# Patient Record
Sex: Male | Born: 1940 | Hispanic: No | Marital: Married | Smoking: Never smoker
Health system: Southern US, Community
[De-identification: ages and names within clinical notes are randomized; demographics above are authoritative.]

## PROBLEM LIST (undated history)

## (undated) DIAGNOSIS — I10 Essential (primary) hypertension: Secondary | ICD-10-CM

## (undated) DIAGNOSIS — H269 Unspecified cataract: Secondary | ICD-10-CM

## (undated) DIAGNOSIS — L409 Psoriasis, unspecified: Secondary | ICD-10-CM

## (undated) DIAGNOSIS — N200 Calculus of kidney: Secondary | ICD-10-CM

## (undated) HISTORY — PX: WRIST SURGERY: SHX841

## (undated) HISTORY — PX: EYE SURGERY: SHX253

---

## 2004-05-23 ENCOUNTER — Encounter: Admission: RE | Admit: 2004-05-23 | Discharge: 2004-05-23 | Payer: Self-pay | Admitting: Specialist

## 2004-08-12 ENCOUNTER — Encounter: Admission: RE | Admit: 2004-08-12 | Discharge: 2004-08-12 | Payer: Self-pay | Admitting: Family Medicine

## 2005-12-15 ENCOUNTER — Ambulatory Visit: Payer: Self-pay | Admitting: Family Medicine

## 2005-12-15 LAB — CONVERTED CEMR LAB
ALT: 17 units/L (ref 0–40)
Basophils Absolute: 0.1 10*3/uL (ref 0.0–0.1)
Basophils Relative: 0.5 % (ref 0.0–1.0)
Calcium: 9.1 mg/dL (ref 8.4–10.5)
Chloride: 105 meq/L (ref 96–112)
Creatinine, Ser: 1.1 mg/dL (ref 0.4–1.5)
Eosinophil percent: 3.9 % (ref 0.0–5.0)
Glucose, Bld: 112 mg/dL — ABNORMAL HIGH (ref 70–99)
HCT: 40.2 % (ref 39.0–52.0)
Hemoglobin: 13.7 g/dL (ref 13.0–17.0)
Monocytes Absolute: 1.1 10*3/uL — ABNORMAL HIGH (ref 0.2–0.7)
Neutro Abs: 7.6 10*3/uL (ref 1.4–7.7)
Neutrophils Relative %: 74.2 % (ref 43.0–77.0)
Potassium: 3.6 meq/L (ref 3.5–5.1)
RBC: 4 M/uL — ABNORMAL LOW (ref 4.22–5.81)
RDW: 13.6 % (ref 11.5–14.6)
Sodium: 138 meq/L (ref 135–145)
WBC: 10.3 10*3/uL (ref 4.5–10.5)

## 2006-01-19 ENCOUNTER — Ambulatory Visit: Payer: Self-pay | Admitting: Family Medicine

## 2006-01-19 LAB — CONVERTED CEMR LAB
AST: 26 units/L (ref 0–37)
Albumin: 3.1 g/dL — ABNORMAL LOW (ref 3.5–5.2)
Alkaline Phosphatase: 136 units/L — ABNORMAL HIGH (ref 39–117)
HCT: 39.3 % (ref 39.0–52.0)
Hemoglobin: 12.9 g/dL — ABNORMAL LOW (ref 13.0–17.0)
MCHC: 32.9 g/dL (ref 30.0–36.0)
Total Bilirubin: 0.8 mg/dL (ref 0.3–1.2)
Total Protein: 6.8 g/dL (ref 6.0–8.3)
WBC: 6.8 10*3/uL (ref 4.5–10.5)

## 2006-03-30 ENCOUNTER — Ambulatory Visit: Payer: Self-pay | Admitting: Family Medicine

## 2006-04-04 ENCOUNTER — Ambulatory Visit: Payer: Self-pay | Admitting: Family Medicine

## 2006-04-04 LAB — CONVERTED CEMR LAB
Basophils Relative: 0.1 % (ref 0.0–1.0)
Bilirubin, Direct: 0.1 mg/dL (ref 0.0–0.3)
Eosinophils Absolute: 0.2 10*3/uL (ref 0.0–0.6)
Eosinophils Relative: 2.8 % (ref 0.0–5.0)
MCHC: 34 g/dL (ref 30.0–36.0)
MCV: 97.7 fL (ref 78.0–100.0)
Monocytes Relative: 8.8 % (ref 3.0–11.0)
Neutro Abs: 5.4 10*3/uL (ref 1.4–7.7)
Neutrophils Relative %: 71.8 % (ref 43.0–77.0)
Platelets: 223 10*3/uL (ref 150–400)
RBC: 4.29 M/uL (ref 4.22–5.81)
Total Bilirubin: 1.2 mg/dL (ref 0.3–1.2)
Total Protein: 7.3 g/dL (ref 6.0–8.3)

## 2006-04-13 ENCOUNTER — Ambulatory Visit: Payer: Self-pay | Admitting: Family Medicine

## 2006-04-27 ENCOUNTER — Ambulatory Visit: Payer: Self-pay | Admitting: Family Medicine

## 2006-05-10 ENCOUNTER — Ambulatory Visit: Payer: Self-pay | Admitting: Internal Medicine

## 2006-06-19 DIAGNOSIS — D649 Anemia, unspecified: Secondary | ICD-10-CM | POA: Insufficient documentation

## 2006-06-19 DIAGNOSIS — L408 Other psoriasis: Secondary | ICD-10-CM | POA: Insufficient documentation

## 2006-06-29 ENCOUNTER — Ambulatory Visit: Payer: Self-pay | Admitting: Family Medicine

## 2006-07-06 ENCOUNTER — Ambulatory Visit: Payer: Self-pay | Admitting: Family Medicine

## 2006-07-09 ENCOUNTER — Telehealth (INDEPENDENT_AMBULATORY_CARE_PROVIDER_SITE_OTHER): Payer: Self-pay | Admitting: *Deleted

## 2006-07-17 ENCOUNTER — Telehealth (INDEPENDENT_AMBULATORY_CARE_PROVIDER_SITE_OTHER): Payer: Self-pay | Admitting: *Deleted

## 2006-07-18 LAB — CONVERTED CEMR LAB
Basophils Absolute: 0 10*3/uL (ref 0.0–0.1)
Bilirubin, Direct: 0.1 mg/dL (ref 0.0–0.3)
Eosinophils Absolute: 0.3 10*3/uL (ref 0.0–0.6)
Eosinophils Relative: 4.9 % (ref 0.0–5.0)
HCT: 43.9 % (ref 39.0–52.0)
MCHC: 34.5 g/dL (ref 30.0–36.0)
MCV: 100.9 fL — ABNORMAL HIGH (ref 78.0–100.0)
Monocytes Absolute: 0.7 10*3/uL (ref 0.2–0.7)
Monocytes Relative: 11.8 % — ABNORMAL HIGH (ref 3.0–11.0)
Neutro Abs: 4.1 10*3/uL (ref 1.4–7.7)
Neutrophils Relative %: 63.8 % (ref 43.0–77.0)
RBC: 4.35 M/uL (ref 4.22–5.81)
RDW: 14 % (ref 11.5–14.6)
Total Protein: 7.3 g/dL (ref 6.0–8.3)

## 2006-07-19 ENCOUNTER — Encounter (INDEPENDENT_AMBULATORY_CARE_PROVIDER_SITE_OTHER): Payer: Self-pay | Admitting: *Deleted

## 2006-10-05 ENCOUNTER — Ambulatory Visit: Payer: Self-pay | Admitting: Family Medicine

## 2006-10-12 LAB — CONVERTED CEMR LAB
AST: 55 units/L — ABNORMAL HIGH (ref 0–37)
Albumin: 4 g/dL (ref 3.5–5.2)
Bilirubin, Direct: 0.2 mg/dL (ref 0.0–0.3)
Total Bilirubin: 0.8 mg/dL (ref 0.3–1.2)

## 2006-10-19 ENCOUNTER — Encounter (INDEPENDENT_AMBULATORY_CARE_PROVIDER_SITE_OTHER): Payer: Self-pay | Admitting: Family Medicine

## 2006-10-19 ENCOUNTER — Telehealth (INDEPENDENT_AMBULATORY_CARE_PROVIDER_SITE_OTHER): Payer: Self-pay | Admitting: *Deleted

## 2006-11-05 ENCOUNTER — Telehealth (INDEPENDENT_AMBULATORY_CARE_PROVIDER_SITE_OTHER): Payer: Self-pay | Admitting: Family Medicine

## 2009-02-11 ENCOUNTER — Emergency Department (HOSPITAL_BASED_OUTPATIENT_CLINIC_OR_DEPARTMENT_OTHER): Admission: EM | Admit: 2009-02-11 | Discharge: 2009-02-11 | Payer: Self-pay | Admitting: Emergency Medicine

## 2009-02-11 ENCOUNTER — Ambulatory Visit: Payer: Self-pay | Admitting: Radiology

## 2009-02-18 ENCOUNTER — Ambulatory Visit (HOSPITAL_BASED_OUTPATIENT_CLINIC_OR_DEPARTMENT_OTHER): Admission: RE | Admit: 2009-02-18 | Discharge: 2009-02-18 | Payer: Self-pay | Admitting: Orthopedic Surgery

## 2009-03-22 ENCOUNTER — Encounter: Admission: RE | Admit: 2009-03-22 | Discharge: 2009-04-29 | Payer: Self-pay | Admitting: Orthopedic Surgery

## 2010-04-18 LAB — BASIC METABOLIC PANEL: GFR calc non Af Amer: >60 mL/min (ref >60)

## 2010-04-18 LAB — GLUCOSE, CAPILLARY: Glucose-Capillary: 161 mg/dL — ABNORMAL HIGH (ref 70–99)

## 2010-04-18 LAB — POCT HEMOGLOBIN-HEMACUE: Hemoglobin: 13.9 g/dL (ref 13.0–17.0)

## 2010-06-14 NOTE — Assessment & Plan Note (Signed)
[facility]                        [facility] OFFICE NOTE   Sandy Salaam                        MRN:          [id number]  DATE:06/29/2006                            DOB:          03/28/1940    REASON FOR VISIT:  Follow up. Mr. Eric Maynard presents for follow up. He  has been doing very on his methotrexate. He reports no side effects. He  states that there has no been significant flare up of his psoriasis. He  denies any chest pain, shortness of breath, or dyspnea on exertion. He  denies any weight loss, weight gain, fevers, chills, or shortness of  breath. He denies any GI symptoms. The patient reports that he feels  great.   MEDICATIONS:  Methotrexate 2.5 mg tablets, 8 tablets on Thursday.   ALLERGIES:  No known drug allergies.   OBJECTIVE:  VITAL SIGNS:  Blood pressure 120/70, weight 169.2,  temperature 98.4, pulse 72, respiratory rate 16.  GENERAL:  This is a pleasant male in no acute distress, dressed  appropriately.  HEENT:  Unremarkable.  NECK:  Supple, no lymphadenopathy, carotid bruits, or JVD.  LUNGS:  Clear.  HEART:  Regular rate and rhythm, normal S1, S2, no murmurs, rubs, or  gallops.  ABDOMEN:  Benign.  SKIN:  No significant psoriasis noted. No scaling appreciated.   IMPRESSION:  Psoriasis on chronic therapy with methotrexate. He was  previously evaluated by rheumatology. Recommended continued monitoring  of lab work every 3 months.   PLAN:  1. The patient will be scheduled for a CBC and a liver profile.  2. The patient is to follow up with me in three months, and sooner if      need be.     Leanne Chang, M.D.  Electronically Signed    LA/Med[facility]  DD: 06/29/2006  DT: 06/30/2006  Job #: [id number]

## 2012-08-01 ENCOUNTER — Encounter (HOSPITAL_COMMUNITY): Payer: Self-pay | Admitting: *Deleted

## 2012-08-01 ENCOUNTER — Emergency Department (HOSPITAL_BASED_OUTPATIENT_CLINIC_OR_DEPARTMENT_OTHER)
Admission: EM | Admit: 2012-08-01 | Discharge: 2012-08-01 | Disposition: A | Discharge status: Home / Self Care | Payer: Medicare Other | Source: Home / Self Care | Attending: Emergency Medicine | Admitting: Emergency Medicine

## 2012-08-01 ENCOUNTER — Observation Stay (HOSPITAL_COMMUNITY)
Admission: EM | Admit: 2012-08-01 | Discharge: 2012-08-03 | Disposition: A | Discharge status: Home / Self Care | Payer: Medicare Other | Attending: Surgery | Admitting: Surgery

## 2012-08-01 ENCOUNTER — Encounter (HOSPITAL_BASED_OUTPATIENT_CLINIC_OR_DEPARTMENT_OTHER): Payer: Self-pay

## 2012-08-01 ENCOUNTER — Emergency Department (HOSPITAL_COMMUNITY): Payer: Medicare Other

## 2012-08-01 ENCOUNTER — Emergency Department (HOSPITAL_BASED_OUTPATIENT_CLINIC_OR_DEPARTMENT_OTHER): Payer: Medicare Other

## 2012-08-01 DIAGNOSIS — K7460 Unspecified cirrhosis of liver: Secondary | ICD-10-CM | POA: Insufficient documentation

## 2012-08-01 DIAGNOSIS — K8050 Calculus of bile duct without cholangitis or cholecystitis without obstruction: Secondary | ICD-10-CM

## 2012-08-01 DIAGNOSIS — K8020 Calculus of gallbladder without cholecystitis without obstruction: Secondary | ICD-10-CM | POA: Insufficient documentation

## 2012-08-01 DIAGNOSIS — D696 Thrombocytopenia, unspecified: Secondary | ICD-10-CM | POA: Insufficient documentation

## 2012-08-01 DIAGNOSIS — I10 Essential (primary) hypertension: Secondary | ICD-10-CM | POA: Insufficient documentation

## 2012-08-01 DIAGNOSIS — N289 Disorder of kidney and ureter, unspecified: Secondary | ICD-10-CM | POA: Insufficient documentation

## 2012-08-01 DIAGNOSIS — E871 Hypo-osmolality and hyponatremia: Secondary | ICD-10-CM | POA: Insufficient documentation

## 2012-08-01 DIAGNOSIS — Z8669 Personal history of other diseases of the nervous system and sense organs: Secondary | ICD-10-CM | POA: Insufficient documentation

## 2012-08-01 DIAGNOSIS — R1011 Right upper quadrant pain: Secondary | ICD-10-CM | POA: Insufficient documentation

## 2012-08-01 DIAGNOSIS — L408 Other psoriasis: Secondary | ICD-10-CM | POA: Insufficient documentation

## 2012-08-01 DIAGNOSIS — Z87442 Personal history of urinary calculi: Secondary | ICD-10-CM | POA: Insufficient documentation

## 2012-08-01 DIAGNOSIS — R109 Unspecified abdominal pain: Secondary | ICD-10-CM

## 2012-08-01 DIAGNOSIS — N2889 Other specified disorders of kidney and ureter: Secondary | ICD-10-CM

## 2012-08-01 DIAGNOSIS — K8010 Calculus of gallbladder with chronic cholecystitis without obstruction: Principal | ICD-10-CM | POA: Insufficient documentation

## 2012-08-01 HISTORY — DX: Unspecified cataract: H26.9

## 2012-08-01 HISTORY — DX: Calculus of kidney: N20.0

## 2012-08-01 HISTORY — DX: Essential (primary) hypertension: I10

## 2012-08-01 LAB — COMPREHENSIVE METABOLIC PANEL
AST: 24 U/L (ref 0–37)
Alkaline Phosphatase: 198 U/L — ABNORMAL HIGH (ref 39–117)
BUN: 13 mg/dL (ref 6–23)
BUN: 16 mg/dL (ref 6–23)
CO2: 25 mEq/L (ref 19–32)
Calcium: 8.9 mg/dL (ref 8.4–10.5)
Chloride: 99 mEq/L (ref 96–112)
Chloride: 96 mEq/L (ref 96–112)
Creatinine, Ser: 1.14 mg/dL (ref 0.50–1.35)
GFR calc Af Amer: 67 mL/min — ABNORMAL LOW (ref >90)
GFR calc Af Amer: 72 mL/min — ABNORMAL LOW (ref >90)
GFR calc non Af Amer: 58 mL/min — ABNORMAL LOW (ref >90)
GFR calc non Af Amer: 62 mL/min — ABNORMAL LOW (ref >90)
Glucose, Bld: 211 mg/dL — ABNORMAL HIGH (ref 70–99)
Potassium: 3.7 mEq/L (ref 3.5–5.1)
Total Bilirubin: 1.6 mg/dL — ABNORMAL HIGH (ref 0.3–1.2)
Total Bilirubin: 1 mg/dL (ref 0.3–1.2)

## 2012-08-01 LAB — CBC WITH DIFFERENTIAL/PLATELET
Basophils Absolute: 0.1 10*3/uL (ref 0.0–0.1)
Basophils Relative: 1 % (ref 0–1)
Eosinophils Relative: 1 % (ref 0–5)
Eosinophils Relative: 3 % (ref 0–5)
HCT: 36 % — ABNORMAL LOW (ref 39.0–52.0)
Hemoglobin: 12.3 g/dL — ABNORMAL LOW (ref 13.0–17.0)
Hemoglobin: 12.2 g/dL — ABNORMAL LOW (ref 13.0–17.0)
Lymphocytes Relative: 7 % — ABNORMAL LOW (ref 12–46)
Lymphocytes Relative: 13 % (ref 12–46)
MCHC: 33.9 g/dL (ref 30.0–36.0)
MCV: 103.4 fL — ABNORMAL HIGH (ref 78.0–100.0)
Monocytes Absolute: 1.1 10*3/uL — ABNORMAL HIGH (ref 0.1–1.0)
Monocytes Relative: 11 % (ref 3–12)
Monocytes Relative: 12 % (ref 3–12)
Neutro Abs: 6.8 10*3/uL (ref 1.7–7.7)
Neutrophils Relative %: 80 % — ABNORMAL HIGH (ref 43–77)
RBC: 3.41 MIL/uL — ABNORMAL LOW (ref 4.22–5.81)
RDW: 14.8 % (ref 11.5–15.5)
WBC: 8.5 10*3/uL (ref 4.0–10.5)
WBC: 9.5 10*3/uL (ref 4.0–10.5)

## 2012-08-01 LAB — LIPASE, BLOOD
Lipase: 66 U/L — ABNORMAL HIGH (ref 11–59)
Lipase: 44 U/L (ref 11–59)

## 2012-08-01 LAB — URINE MICROSCOPIC-ADD ON

## 2012-08-01 LAB — URINALYSIS, ROUTINE W REFLEX MICROSCOPIC
Glucose, UA: NEGATIVE mg/dL
Hgb urine dipstick: NEGATIVE
Protein, ur: 30 mg/dL — AB
Urobilinogen, UA: 1 mg/dL (ref 0.0–1.0)

## 2012-08-01 MED ORDER — ONDANSETRON HCL 4 MG/2ML IJ SOLN
4.0000 mg | Freq: Once | INTRAMUSCULAR | Status: AC
  Administered 2012-08-02: 4 mg via INTRAVENOUS
  Filled 2012-08-01: qty 2

## 2012-08-01 MED ORDER — ONDANSETRON 8 MG PO TBDP: ORAL_TABLET | ORAL | Status: DC

## 2012-08-01 MED ORDER — DOCUSATE SODIUM 100 MG PO CAPS
100.0000 mg | ORAL_CAPSULE | Freq: Two times a day (BID) | ORAL | Status: DC
Start: 2012-08-01 — End: 2021-07-29

## 2012-08-01 MED ORDER — ONDANSETRON HCL 4 MG/2ML IJ SOLN
4.0000 mg | Freq: Once | INTRAMUSCULAR | Status: AC
  Administered 2012-08-01: 4 mg via INTRAVENOUS
  Filled 2012-08-01: qty 2

## 2012-08-01 MED ORDER — SODIUM CHLORIDE 0.9 % IV SOLN
Freq: Once | INTRAVENOUS | Status: AC
  Administered 2012-08-02: via INTRAVENOUS

## 2012-08-01 MED ORDER — HYDROMORPHONE HCL PF 1 MG/ML IJ SOLN
0.5000 mg | Freq: Once | INTRAMUSCULAR | Status: AC
  Administered 2012-08-02: 0.5 mg via INTRAVENOUS
  Filled 2012-08-01: qty 1

## 2012-08-01 MED ORDER — OXYCODONE-ACETAMINOPHEN 5-325 MG PO TABS: 2.0000 | ORAL_TABLET | ORAL | Status: DC | PRN

## 2012-08-01 MED ORDER — MORPHINE SULFATE 4 MG/ML IJ SOLN
4.0000 mg | Freq: Once | INTRAMUSCULAR | Status: AC
  Administered 2012-08-01: 4 mg via INTRAVENOUS
  Filled 2012-08-01: qty 1

## 2012-08-01 NOTE — ED Notes (Signed)
Patient transported to CT 

## 2012-08-01 NOTE — ED Provider Notes (Signed)
History    CSN: [id number] Arrival date & time 08/01/12  0858  First MD Initiated Contact with Patient 08/01/12 [phone]     Chief Complaint  Patient presents with  . Abdominal Pain   (Consider location/radiation/quality/duration/timing/severity/associated sxs/prior Treatment) HPI Comments: Patient presents with right-sided abdominal pain. He states it started this morning and is been constant since then. It's in his right midabdomen is nonradiating otherwise. He denies any nausea vomiting or fevers. He denies any problems urinating. He's having normal bowel movements. He has a history of a kidney stone one time in the past about 4 years ago with similar type symptoms. He denies any abdominal surgeries in the past. He describes as an achy pain in his midabdomen.  Patient is a 72 y.o. male presenting with abdominal pain.  Abdominal Pain Associated symptoms include abdominal pain. Pertinent negatives include no chest pain, no headaches and no shortness of breath.   Past Medical History  Diagnosis Date  . Hypertension   . Kidney stone   . Cataract    Past Surgical History  Procedure Laterality Date  . Wrist surgery    . Eye surgery     No family history on file. History  Substance Use Topics  . Smoking status: Never Smoker   . Smokeless tobacco: Not on file  . Alcohol Use: Yes     Comment: occasional    Review of Systems  Constitutional: Negative for fever, chills, diaphoresis and fatigue.  HENT: Negative for congestion, rhinorrhea and sneezing.   Eyes: Negative.   Respiratory: Negative for cough, chest tightness and shortness of breath.   Cardiovascular: Negative for chest pain and leg swelling.  Gastrointestinal: Positive for abdominal pain. Negative for nausea, vomiting, diarrhea and blood in stool.  Genitourinary: Negative for frequency, hematuria, flank pain and difficulty urinating.  Musculoskeletal: Negative for back pain and arthralgias.  Skin: Negative for rash.   Neurological: Negative for dizziness, speech difficulty, weakness, numbness and headaches.    Allergies  Review of patient's allergies indicates no known allergies.  Home Medications   Current Outpatient Rx  Name  Route  Sig  Dispense  Refill  . FOLIC ACID PO   Oral   Take by mouth.         . Methotrexate Sodium (METHOTREXATE PO)   Oral   Take by mouth.         Marland Kitchen UNKNOWN TO PATIENT               . docusate sodium (COLACE) 100 MG capsule   Oral   Take 1 capsule (100 mg total) by mouth every 12 (twelve) hours.   60 capsule   0   . ondansetron (ZOFRAN ODT) 8 MG disintegrating tablet      8mg  ODT q4 hours prn nausea   10 tablet   0   . oxyCODONE-acetaminophen (PERCOCET) 5-325 MG per tablet   Oral   Take 2 tablets by mouth every 4 (four) hours as needed for pain.   20 tablet   0    BP 132/62  Pulse 68  Temp(Src) 97.8 F (36.6 C) (Oral)  Resp 18  Ht 5\' 10"  (1.778 m)  Wt 162 lb (73.483 kg)  BMI 23.24 kg/m2  SpO2 99% Physical Exam  Constitutional: He is oriented to person, place, and time. He appears well-developed and well-nourished.  HENT:  Head: Normocephalic and atraumatic.  Eyes: Pupils are equal, round, and reactive to light.  Neck: Normal range of motion. Neck supple.  Cardiovascular: Normal rate, regular rhythm and normal heart sounds.   Pulmonary/Chest: Effort normal and breath sounds normal. No respiratory distress. He has no wheezes. He has no rales. He exhibits no tenderness.  Abdominal: Soft. Bowel sounds are normal. There is tenderness (moderate tenderness to the right midabdomen.). There is no rebound and no guarding.  No CVA tenderness  Musculoskeletal: Normal range of motion. He exhibits no edema.  Lymphadenopathy:    He has no cervical adenopathy.  Neurological: He is alert and oriented to person, place, and time.  Skin: Skin is warm and dry. No rash noted.  Psychiatric: He has a normal mood and affect.    ED Course  Procedures  (including critical care time) Results for orders placed during the hospital encounter of 08/01/12  CBC WITH DIFFERENTIAL      Result Value Range   WBC 8.5  4.0 - 10.5 K/uL   RBC 3.41 (*) 4.22 - 5.81 MIL/uL   Hemoglobin 12.3 (*) 13.0 - 17.0 g/dL   HCT 04.5 (*) 40.9 - 81.1 %   MCV 104.4 (*) 78.0 - 100.0 fL   MCH 36.1 (*) 26.0 - 34.0 pg   MCHC 34.6  30.0 - 36.0 g/dL   RDW 91.4  78.2 - 95.6 %   Platelets 70 (*) 150 - 400 K/uL   Neutrophils Relative % 80 (*) 43 - 77 %   Lymphocytes Relative 7 (*) 12 - 46 %   Monocytes Relative 11  3 - 12 %   Eosinophils Relative 1  0 - 5 %   Basophils Relative 1  0 - 1 %   Neutro Abs 6.8  1.7 - 7.7 K/uL   Lymphs Abs 0.6 (*) 0.7 - 4.0 K/uL   Monocytes Absolute 0.9  0.1 - 1.0 K/uL   Eosinophils Absolute 0.1  0.0 - 0.7 K/uL   Basophils Absolute 0.1  0.0 - 0.1 K/uL   Smear Review PLATELET COUNT CONFIRMED BY SMEAR    COMPREHENSIVE METABOLIC PANEL      Result Value Range   Sodium 133 (*) 135 - 145 mEq/L   Potassium 3.7  3.5 - 5.1 mEq/L   Chloride 99  96 - 112 mEq/L   CO2 23  19 - 32 mEq/L   Glucose, Bld 211 (*) 70 - 99 mg/dL   BUN 13  6 - 23 mg/dL   Creatinine, Ser 2.13  0.50 - 1.35 mg/dL   Calcium 9.4  8.4 - 08.6 mg/dL   Total Protein 7.6  6.0 - 8.3 g/dL   Albumin 3.7  3.5 - 5.2 g/dL   AST 28  0 - 37 U/L   ALT 22  0 - 53 U/L   Alkaline Phosphatase 198 (*) 39 - 117 U/L   Total Bilirubin 1.6 (*) 0.3 - 1.2 mg/dL   GFR calc non Af Amer 58 (*) >90 mL/min   GFR calc Af Amer 67 (*) >90 mL/min  LIPASE, BLOOD      Result Value Range   Lipase 66 (*) 11 - 59 U/L  URINALYSIS, ROUTINE W REFLEX MICROSCOPIC      Result Value Range   Color, Urine ORANGE (*) YELLOW   APPearance CLEAR  CLEAR   Specific Gravity, Urine 1.026  1.005 - 1.030   pH 5.5  5.0 - 8.0   Glucose, UA NEGATIVE  NEGATIVE mg/dL   Hgb urine dipstick NEGATIVE  NEGATIVE   Bilirubin Urine SMALL (*) NEGATIVE   Ketones, ur 15 (*) NEGATIVE mg/dL  Protein, ur 30 (*) NEGATIVE mg/dL    Urobilinogen, UA 1.0  0.0 - 1.0 mg/dL   Nitrite NEGATIVE  NEGATIVE   Leukocytes, UA SMALL (*) NEGATIVE  URINE MICROSCOPIC-ADD ON      Result Value Range   Squamous Epithelial / LPF FEW (*) RARE   WBC, UA 7-10  <3 WBC/hpf   RBC / HPF 0-2  <3 RBC/hpf   Bacteria, UA MANY (*) RARE   Urine-Other MUCOUS PRESENT     Ct Abdomen Pelvis Wo Contrast  08/01/2012   *RADIOLOGY REPORT*  Clinical Data: Right flank pain  CT ABDOMEN AND PELVIS WITHOUT CONTRAST  Technique:  Multidetector CT imaging of the abdomen and pelvis was performed following the standard protocol without intravenous contrast.  Comparison: None.  Findings: Lung bases are unremarkable.  Sagittal images of the spine shows multilevel mild generative changes.  Disc space flattening with endplate sclerotic changes and posterior spurring at L5-S1 level.  Atherosclerotic calcifications of the abdominal aorta and the iliac arteries.  Unenhanced liver shows no biliary ductal dilatation.  There is a distended gallbladder.  No thickening of gallbladder wall.  At least one gallstone is noted within gallbladder measures 1.5 cm.  Small hiatal hernia.  Unenhanced pancreas, spleen and adrenals are unremarkable.  Unenhanced kidneys are symmetrical in size. Multiple bilateral nonobstructive renal calcifications are noted. There are at least four nonobstructive calcified calculi within right kidney largest measures 3.6 mm.  At least four or five calcified calculi are noted within the left kidney nonobstructive the largest in mid pole measures 4.8 mm.  There is a probable cyst in mid pole of the right kidney measures 1.2 cm.  There is lobulated contour of the left kidney.    Small high density exophytic lesion in mid pole measures 5 mm.  Further evaluation with enhanced CT or MRI is recommended.  No hydronephrosis or hydroureter.  No calcified ureteral calculi. Nonspecific mild distention of the jejunum without surrounding inflammatory changes.  Moderate colonic stool.   No pericecal inflammation.  The terminal ileum is unremarkable.  Normal appendix partially visualized in axial image 61.  Left colon and sigmoid colon diverticula are noted.  No evidence of acute diverticulitis.  Nonspecific mild thickening the urinary bladder wall.  Prostate gland measures 2.9 x 4 cm.  IMPRESSION:  1.  No hydronephrosis or hydroureter.  Bilateral nonobstructive nephrolithiasis.  Small high density exophytic lesion mid pole of the left kidney measures 5 mm.  Further evaluation with enhanced CT or MRI is recommended. 2.  Probable right renal cyst measures 1.2 cm.  No calcified ureteral calculi. 3.  Normal appendix.  No pericecal inflammation.  4.  Left colon and sigmoid colon diverticula.  No evidence of acute diverticulitis. 5.  Distended gallbladder.  No evidence of pericholecystic fluid. Gallstone within gallbladder measures 1.5 cm. 6.  Nonspecific mild thickening of urinary bladder wall.   Original Report Authenticated By: Natasha Mead, M.D.     Ct Abdomen Pelvis Wo Contrast  08/01/2012   *RADIOLOGY REPORT*  Clinical Data: Right flank pain  CT ABDOMEN AND PELVIS WITHOUT CONTRAST  Technique:  Multidetector CT imaging of the abdomen and pelvis was performed following the standard protocol without intravenous contrast.  Comparison: None.  Findings: Lung bases are unremarkable.  Sagittal images of the spine shows multilevel mild generative changes.  Disc space flattening with endplate sclerotic changes and posterior spurring at L5-S1 level.  Atherosclerotic calcifications of the abdominal aorta and the iliac arteries.  Unenhanced liver shows no  biliary ductal dilatation.  There is a distended gallbladder.  No thickening of gallbladder wall.  At least one gallstone is noted within gallbladder measures 1.5 cm.  Small hiatal hernia.  Unenhanced pancreas, spleen and adrenals are unremarkable.  Unenhanced kidneys are symmetrical in size. Multiple bilateral nonobstructive renal calcifications are noted.  There are at least four nonobstructive calcified calculi within right kidney largest measures 3.6 mm.  At least four or five calcified calculi are noted within the left kidney nonobstructive the largest in mid pole measures 4.8 mm.  There is a probable cyst in mid pole of the right kidney measures 1.2 cm.  There is lobulated contour of the left kidney.    Small high density exophytic lesion in mid pole measures 5 mm.  Further evaluation with enhanced CT or MRI is recommended.  No hydronephrosis or hydroureter.  No calcified ureteral calculi. Nonspecific mild distention of the jejunum without surrounding inflammatory changes.  Moderate colonic stool.  No pericecal inflammation.  The terminal ileum is unremarkable.  Normal appendix partially visualized in axial image 61.  Left colon and sigmoid colon diverticula are noted.  No evidence of acute diverticulitis.  Nonspecific mild thickening the urinary bladder wall.  Prostate gland measures 2.9 x 4 cm.  IMPRESSION:  1.  No hydronephrosis or hydroureter.  Bilateral nonobstructive nephrolithiasis.  Small high density exophytic lesion mid pole of the left kidney measures 5 mm.  Further evaluation with enhanced CT or MRI is recommended. 2.  Probable right renal cyst measures 1.2 cm.  No calcified ureteral calculi. 3.  Normal appendix.  No pericecal inflammation.  4.  Left colon and sigmoid colon diverticula.  No evidence of acute diverticulitis. 5.  Distended gallbladder.  No evidence of pericholecystic fluid. Gallstone within gallbladder measures 1.5 cm. 6.  Nonspecific mild thickening of urinary bladder wall.   Original Report Authenticated By: Natasha Mead, M.D.   1. Gallstone   2. Biliary colic   3. Thrombocytopenia   4. Left renal mass     MDM  I believe the patient's pain is coming from his gallbladder. There is no gallbladder wall thickening or pericholecystic fluid. He does have a mildly elevated lipase and bilirubin. He has what appears to be a chronically  elevated alkaline phosphatase. I discussed this with Dr. Abbey Chatters with general surgery and he was comfortable with the patient following up as an outpatient. In fact he would rather have the patient's platelet count stabilized prior to surgery. Given his low platelets mild anemia and elevated alkaline phosphatase I did feel that he needs further evaluation by his primary care physician. I discussed these findings with the patient and his family. I also advised him that he has this small mass on his left kidney that will likely need an outpatient MRI. Patient was advised to follow with his primary care physician as soon as possible. I advised him to have the patient return if his symptoms get worse over this holiday weekend. I advised him return for worsening pain vomiting fevers or other worsening symptoms.  Rolan Bucco, MD 08/01/12 [phone]

## 2012-08-01 NOTE — ED Notes (Signed)
Pt reports abdominal pain that started this am. 

## 2012-08-01 NOTE — ED Notes (Signed)
Primary RN attempted access x2 without success. 2nd RN to attempt.

## 2012-08-01 NOTE — ED Notes (Signed)
The pt has had abe pain since 0730 this am and [facility].  He was told he had gallstones and a mass on his kidney.  The pain has contiued in his rt flank and abd.  No nv or diarrhea

## 2012-08-01 NOTE — ED Provider Notes (Signed)
History    CSN: [id number] Arrival date & time 08/01/12  2232  First MD Initiated Contact with Patient 08/01/12 2328     Chief Complaint  Patient presents with  . gallstone pain    (Consider location/radiation/quality/duration/timing/severity/associated sxs/prior Treatment) HPI Comments: 72 year old male who has no prior abdominal surgical history who presents with abdominal pain in the right upper quadrant which started at approximately 7:30 this morning. He was seen at the outside hospital earlier in the day, had lab work and a CT scan which showed an isolated gallstone but no other significant signs of cholecystitis, no other signs of acute pain. He had a abnormal appearing kidney with a possible mass, nonobstructive nephrolithiasis and renal cyst. Throughout the day the patient's pain has been ongoing, it does not seem to be getting better, his last meal was at 4:00 PM, he has nausea but no vomiting. He was noted to have thrombocytopenia on prior workup, family states this is not a new finding.  Symptoms are persistent, gradually worsening, moderate, not associated with fevers.  The history is provided by the patient, a relative and medical records.   Past Medical History  Diagnosis Date  . Hypertension   . Kidney stone   . Cataract    Past Surgical History  Procedure Laterality Date  . Wrist surgery    . Eye surgery     No family history on file. History  Substance Use Topics  . Smoking status: Never Smoker   . Smokeless tobacco: Not on file  . Alcohol Use: Yes     Comment: occasional    Review of Systems  All other systems reviewed and are negative.    Allergies  Review of patient's allergies indicates no known allergies.  Home Medications   Current Outpatient Rx  Name  Route  Sig  Dispense  Refill  . amLODipine (NORVASC) 5 MG tablet   Oral   Take 5 mg by mouth daily.         Marland Kitchen docusate sodium (COLACE) 100 MG capsule   Oral   Take 1 capsule (100 mg  total) by mouth every 12 (twelve) hours.   60 capsule   0   . folic acid (FOLVITE) 400 MCG tablet   Oral   Take 400 mcg by mouth daily.         . methotrexate (RHEUMATREX) 2.5 MG tablet   Oral   Take 17.5 mg by mouth once a week. Caution:Chemotherapy. Protect from light.         . ondansetron (ZOFRAN-ODT) 8 MG disintegrating tablet   Oral   Take 8 mg by mouth every 8 (eight) hours as needed for nausea.         Marland Kitchen oxyCODONE-acetaminophen (PERCOCET) 5-325 MG per tablet   Oral   Take 2 tablets by mouth every 4 (four) hours as needed for pain.   20 tablet   0    BP 137/63  Pulse 69  Temp(Src) 97.5 F (36.4 C) (Oral)  Resp 14  SpO2 96% Physical Exam  Nursing note and vitals reviewed. Constitutional: He appears well-developed and well-nourished. No distress.  HENT:  Head: Normocephalic and atraumatic.  Mouth/Throat: Oropharynx is clear and moist. No oropharyngeal exudate.  Eyes: Conjunctivae and EOM are normal. Pupils are equal, round, and reactive to light. Right eye exhibits no discharge. Left eye exhibits no discharge. No scleral icterus.  Neck: Normal range of motion. Neck supple. No JVD present. No thyromegaly present.  Cardiovascular: Normal rate,  regular rhythm, normal heart sounds and intact distal pulses.  Exam reveals no gallop and no friction rub.   No murmur heard. Pulmonary/Chest: Effort normal and breath sounds normal. No respiratory distress. He has no wheezes. He has no rales.  Abdominal: Soft. Bowel sounds are normal. He exhibits no distension and no mass. There is tenderness ( RUQ ttp, no guarding or masses or peritoneal signs).  Musculoskeletal: Normal range of motion. He exhibits no edema and no tenderness.  Lymphadenopathy:    He has no cervical adenopathy.  Neurological: He is alert. Coordination normal.  Skin: Skin is warm and dry. No rash noted. No erythema.  Psychiatric: He has a normal mood and affect. His behavior is normal.    ED Course   Procedures (including critical care time) Labs Reviewed  CBC WITH DIFFERENTIAL - Abnormal; Notable for the following:    RBC 3.48 (*)    Hemoglobin 12.2 (*)    HCT 36.0 (*)    MCV 103.4 (*)    MCH 35.1 (*)    Platelets 79 (*)    Monocytes Absolute 1.1 (*)    All other components within normal limits  COMPREHENSIVE METABOLIC PANEL - Abnormal; Notable for the following:    Sodium 130 (*)    Glucose, Bld 207 (*)    Albumin 3.4 (*)    Alkaline Phosphatase 185 (*)    GFR calc non Af Amer 62 (*)    GFR calc Af Amer 72 (*)    All other components within normal limits  LIPASE, BLOOD   Ct Abdomen Pelvis Wo Contrast  08/01/2012   *RADIOLOGY REPORT*  Clinical Data: Right flank pain  CT ABDOMEN AND PELVIS WITHOUT CONTRAST  Technique:  Multidetector CT imaging of the abdomen and pelvis was performed following the standard protocol without intravenous contrast.  Comparison: None.  Findings: Lung bases are unremarkable.  Sagittal images of the spine shows multilevel mild generative changes.  Disc space flattening with endplate sclerotic changes and posterior spurring at L5-S1 level.  Atherosclerotic calcifications of the abdominal aorta and the iliac arteries.  Unenhanced liver shows no biliary ductal dilatation.  There is a distended gallbladder.  No thickening of gallbladder wall.  At least one gallstone is noted within gallbladder measures 1.5 cm.  Small hiatal hernia.  Unenhanced pancreas, spleen and adrenals are unremarkable.  Unenhanced kidneys are symmetrical in size. Multiple bilateral nonobstructive renal calcifications are noted. There are at least four nonobstructive calcified calculi within right kidney largest measures 3.6 mm.  At least four or five calcified calculi are noted within the left kidney nonobstructive the largest in mid pole measures 4.8 mm.  There is a probable cyst in mid pole of the right kidney measures 1.2 cm.  There is lobulated contour of the left kidney.    Small high  density exophytic lesion in mid pole measures 5 mm.  Further evaluation with enhanced CT or MRI is recommended.  No hydronephrosis or hydroureter.  No calcified ureteral calculi. Nonspecific mild distention of the jejunum without surrounding inflammatory changes.  Moderate colonic stool.  No pericecal inflammation.  The terminal ileum is unremarkable.  Normal appendix partially visualized in axial image 61.  Left colon and sigmoid colon diverticula are noted.  No evidence of acute diverticulitis.  Nonspecific mild thickening the urinary bladder wall.  Prostate gland measures 2.9 x 4 cm.  IMPRESSION:  1.  No hydronephrosis or hydroureter.  Bilateral nonobstructive nephrolithiasis.  Small high density exophytic lesion mid pole of the  left kidney measures 5 mm.  Further evaluation with enhanced CT or MRI is recommended. 2.  Probable right renal cyst measures 1.2 cm.  No calcified ureteral calculi. 3.  Normal appendix.  No pericecal inflammation.  4.  Left colon and sigmoid colon diverticula.  No evidence of acute diverticulitis. 5.  Distended gallbladder.  No evidence of pericholecystic fluid. Gallstone within gallbladder measures 1.5 cm. 6.  Nonspecific mild thickening of urinary bladder wall.   Original Report Authenticated By: Natasha Mead, M.D.   US Abdomen Complete  08/02/2012   *RADIOLOGY REPORT*  Clinical Data:  Right upper quadrant pain.  Gallstones.  COMPLETE ABDOMINAL ULTRASOUND  Comparison:  CT 08/01/2012.  Findings:  Gallbladder:  17 mm gallstone is present.  Stone is mobile.  No sonographic Murphy's sign.  No wall thickening.  Common bile duct:  5 mm, normal.  Liver:  No focal mass lesion identified.  Coarse echotexture.  IVC:  Appears normal.  Pancreas:  Obscured by overlying bowel gas.  Spleen:  Borderline size.  Right Kidney:  10.7 cm.  19 mm interpolar cyst.  No obstruction or hydronephrosis. Calculi identified on CT not well seen on their ultrasound.  Left Kidney:  Persistent fetal lobation.10.9 cm.   19 mm inferior pole cyst.  The hyperdense lesions seen on prior CT is not visualized by ultrasound.No hydronephrosis.  Abdominal aorta:  No aneurysm identified.  IMPRESSION:  1.  Cholelithiasis without cholecystitis. 2.  Previously demonstrated renal calculi not resolved on ultrasound. 3.  Bilateral renal cysts identified. 4.  5 mm high density lesions seen on prior CT in the interpolar left kidney not visualized.  This may be isoechoic to the kidney. Consider follow-up renal MRI with and without contrast for further assessment. Non-emergent MRI should be deferred until patient has been discharged for the acute illness, and can optimally cooperate with positioning and breath-holding instructions.   Original Report Authenticated By: Andreas Newport, M.D.   1. Abdominal pain     MDM  Pt is having ongoing RUQ pain that is reproducible on exam.  VS normal, labs unremarkable and and at baseline - similar to prior labs earlier in the day.  [place] ordered, Pain meds ordered, IVF, NPO  Labs are unremarkable, vital signs remained unremarkable and the ultrasound does not show any signs of acute cholecystitis. I have discussed the patient's care with Dr. Janee Morn of general surgery who will admit the patient overnight for observation. At this time he does not have a surgical abdomen.  Vida Roller, MD 08/02/12 [phone]

## 2012-08-02 ENCOUNTER — Encounter (HOSPITAL_COMMUNITY)
Admission: EM | Disposition: A | Discharge status: Home / Self Care | Payer: Self-pay | Source: Home / Self Care | Attending: Emergency Medicine

## 2012-08-02 ENCOUNTER — Encounter (HOSPITAL_COMMUNITY): Payer: Self-pay | Admitting: Anesthesiology

## 2012-08-02 ENCOUNTER — Observation Stay (HOSPITAL_COMMUNITY): Payer: Medicare Other

## 2012-08-02 ENCOUNTER — Observation Stay (HOSPITAL_COMMUNITY): Payer: Medicare Other | Admitting: Anesthesiology

## 2012-08-02 DIAGNOSIS — K8010 Calculus of gallbladder with chronic cholecystitis without obstruction: Secondary | ICD-10-CM

## 2012-08-02 DIAGNOSIS — K8020 Calculus of gallbladder without cholecystitis without obstruction: Secondary | ICD-10-CM

## 2012-08-02 DIAGNOSIS — D696 Thrombocytopenia, unspecified: Secondary | ICD-10-CM

## 2012-08-02 DIAGNOSIS — I10 Essential (primary) hypertension: Secondary | ICD-10-CM

## 2012-08-02 DIAGNOSIS — E871 Hypo-osmolality and hyponatremia: Secondary | ICD-10-CM

## 2012-08-02 HISTORY — PX: CHOLECYSTECTOMY: SHX55

## 2012-08-02 LAB — CBC
Hemoglobin: 12.4 g/dL — ABNORMAL LOW (ref 13.0–17.0)
RBC: 3.5 MIL/uL — ABNORMAL LOW (ref 4.22–5.81)
WBC: 9.2 10*3/uL (ref 4.0–10.5)

## 2012-08-02 LAB — URINE CULTURE
Colony Count: NO GROWTH
Culture: NO GROWTH

## 2012-08-02 LAB — SURGICAL PCR SCREEN
MRSA, PCR: NEGATIVE
Staphylococcus aureus: POSITIVE — AB

## 2012-08-02 LAB — COMPREHENSIVE METABOLIC PANEL
ALT: 29 U/L (ref 0–53)
AST: 44 U/L — ABNORMAL HIGH (ref 0–37)
Albumin: 3.1 g/dL — ABNORMAL LOW (ref 3.5–5.2)
Alkaline Phosphatase: 168 U/L — ABNORMAL HIGH (ref 39–117)
Calcium: 8.5 mg/dL (ref 8.4–10.5)
GFR calc Af Amer: 81 mL/min — ABNORMAL LOW (ref >90)
Glucose, Bld: 222 mg/dL — ABNORMAL HIGH (ref 70–99)
Potassium: 3.7 mEq/L (ref 3.5–5.1)
Sodium: 133 mEq/L — ABNORMAL LOW (ref 135–145)
Total Protein: 7 g/dL (ref 6.0–8.3)

## 2012-08-02 SURGERY — LAPAROSCOPIC CHOLECYSTECTOMY WITH INTRAOPERATIVE CHOLANGIOGRAM
Anesthesia: General | Site: Abdomen | Wound class: Contaminated

## 2012-08-02 MED ORDER — ONDANSETRON HCL 4 MG/2ML IJ SOLN
INTRAMUSCULAR | Status: DC | PRN
  Administered 2012-08-02: 4 mg via INTRAVENOUS

## 2012-08-02 MED ORDER — PROPOFOL 10 MG/ML IV BOLUS
INTRAVENOUS | Status: DC | PRN
  Administered 2012-08-02: 40 mg via INTRAVENOUS
  Administered 2012-08-02: 110 mg via INTRAVENOUS

## 2012-08-02 MED ORDER — SODIUM CHLORIDE 0.9 % IR SOLN
Status: DC | PRN
  Administered 2012-08-02: 1000 mL

## 2012-08-02 MED ORDER — KCL IN DEXTROSE-NACL 20-5-0.9 MEQ/L-%-% IV SOLN
INTRAVENOUS | Status: DC
  Administered 2012-08-02 – 2012-08-03 (×3): via INTRAVENOUS
  Filled 2012-08-02 (×4): qty 1000

## 2012-08-02 MED ORDER — SODIUM CHLORIDE 0.9 % IV SOLN
INTRAVENOUS | Status: DC | PRN
  Administered 2012-08-02: 12:00:00

## 2012-08-02 MED ORDER — HEMOSTATIC AGENTS (NO CHARGE) OPTIME
TOPICAL | Status: DC | PRN
  Administered 2012-08-02 (×2): 1 via TOPICAL

## 2012-08-02 MED ORDER — KETOROLAC TROMETHAMINE 30 MG/ML IJ SOLN: 15.0000 mg | Freq: Once | INTRAMUSCULAR | Status: AC | PRN

## 2012-08-02 MED ORDER — DEXAMETHASONE SODIUM PHOSPHATE 4 MG/ML IJ SOLN
INTRAMUSCULAR | Status: DC | PRN
  Administered 2012-08-02: 4 mg via INTRAVENOUS

## 2012-08-02 MED ORDER — HYDROMORPHONE HCL PF 1 MG/ML IJ SOLN
0.2500 mg | INTRAMUSCULAR | Status: DC | PRN
  Administered 2012-08-02 (×3): 0.5 mg via INTRAVENOUS

## 2012-08-02 MED ORDER — LIDOCAINE HCL 4 % MT SOLN
OROMUCOSAL | Status: DC | PRN
  Administered 2012-08-02: 4 mL via TOPICAL

## 2012-08-02 MED ORDER — GLYCOPYRROLATE 0.2 MG/ML IJ SOLN
INTRAMUSCULAR | Status: DC | PRN
  Administered 2012-08-02: 0.6 mg via INTRAVENOUS

## 2012-08-02 MED ORDER — MORPHINE SULFATE 2 MG/ML IJ SOLN
2.0000 mg | INTRAMUSCULAR | Status: DC | PRN
  Administered 2012-08-02 (×3): 2 mg via INTRAVENOUS
  Filled 2012-08-02 (×3): qty 1

## 2012-08-02 MED ORDER — PHENYLEPHRINE HCL 10 MG/ML IJ SOLN
INTRAMUSCULAR | Status: DC | PRN
  Administered 2012-08-02 (×2): 40 ug via INTRAVENOUS

## 2012-08-02 MED ORDER — ROCURONIUM BROMIDE 100 MG/10ML IV SOLN
INTRAVENOUS | Status: DC | PRN
  Administered 2012-08-02: 40 mg via INTRAVENOUS

## 2012-08-02 MED ORDER — NEOSTIGMINE METHYLSULFATE 1 MG/ML IJ SOLN
INTRAMUSCULAR | Status: DC | PRN
  Administered 2012-08-02: 4 mg via INTRAVENOUS

## 2012-08-02 MED ORDER — CIPROFLOXACIN IN D5W 400 MG/200ML IV SOLN
400.0000 mg | Freq: Two times a day (BID) | INTRAVENOUS | Status: DC
  Administered 2012-08-02: 400 mg via INTRAVENOUS
  Filled 2012-08-02 (×3): qty 200

## 2012-08-02 MED ORDER — OXYCODONE HCL 5 MG PO TABS
10.0000 mg | ORAL_TABLET | ORAL | Status: DC | PRN
  Administered 2012-08-02 – 2012-08-03 (×2): 10 mg via ORAL
  Filled 2012-08-02 (×2): qty 2

## 2012-08-02 MED ORDER — EPHEDRINE SULFATE 50 MG/ML IJ SOLN
INTRAMUSCULAR | Status: DC | PRN
  Administered 2012-08-02 (×4): 10 mg via INTRAVENOUS

## 2012-08-02 MED ORDER — DIPHENHYDRAMINE HCL 50 MG/ML IJ SOLN: 12.5000 mg | Freq: Four times a day (QID) | INTRAMUSCULAR | Status: DC | PRN

## 2012-08-02 MED ORDER — METOPROLOL TARTRATE 1 MG/ML IV SOLN
INTRAVENOUS | Status: DC | PRN
  Administered 2012-08-02 (×2): 2 mg via INTRAVENOUS
  Administered 2012-08-02: 1 mg via INTRAVENOUS

## 2012-08-02 MED ORDER — DIPHENHYDRAMINE HCL 12.5 MG/5ML PO ELIX: 12.5000 mg | ORAL_SOLUTION | Freq: Four times a day (QID) | ORAL | Status: DC | PRN

## 2012-08-02 MED ORDER — SODIUM CHLORIDE 0.9 % IR SOLN
Status: DC | PRN
  Administered 2012-08-02 (×2): 1000 mL

## 2012-08-02 MED ORDER — BUPIVACAINE-EPINEPHRINE 0.25% -1:200000 IJ SOLN
INTRAMUSCULAR | Status: DC | PRN
  Administered 2012-08-02: 30 mL

## 2012-08-02 MED ORDER — FENTANYL CITRATE 0.05 MG/ML IJ SOLN
INTRAMUSCULAR | Status: DC | PRN
  Administered 2012-08-02 (×2): 50 ug via INTRAVENOUS
  Administered 2012-08-02: 100 ug via INTRAVENOUS

## 2012-08-02 MED ORDER — LIDOCAINE HCL (CARDIAC) 20 MG/ML IV SOLN
INTRAVENOUS | Status: DC | PRN
  Administered 2012-08-02: 30 mg via INTRAVENOUS

## 2012-08-02 MED ORDER — AMLODIPINE BESYLATE 5 MG PO TABS
5.0000 mg | ORAL_TABLET | Freq: Every day | ORAL | Status: DC
  Administered 2012-08-03: 5 mg via ORAL
  Filled 2012-08-02 (×2): qty 1

## 2012-08-02 MED ORDER — PANTOPRAZOLE SODIUM 40 MG IV SOLR
40.0000 mg | Freq: Every day | INTRAVENOUS | Status: DC
  Filled 2012-08-02: qty 40

## 2012-08-02 MED ORDER — ONDANSETRON HCL 4 MG/2ML IJ SOLN: 4.0000 mg | Freq: Four times a day (QID) | INTRAMUSCULAR | Status: DC | PRN

## 2012-08-02 MED ORDER — ONDANSETRON HCL 4 MG/2ML IJ SOLN: 4.0000 mg | Freq: Once | INTRAMUSCULAR | Status: AC | PRN

## 2012-08-02 MED ORDER — LACTATED RINGERS IV SOLN
INTRAVENOUS | Status: DC | PRN
  Administered 2012-08-02 (×2): via INTRAVENOUS

## 2012-08-02 SURGICAL SUPPLY — 50 items
APPLIER CLIP 5 13 M/L LIGAMAX5 (MISCELLANEOUS)
APPLIER CLIP ROT 10 11.4 M/L (STAPLE) ×2
BLADE SURG ROTATE 9660 (MISCELLANEOUS) ×2 IMPLANT
CANISTER SUCTION 2500CC (MISCELLANEOUS) ×2 IMPLANT
CHLORAPREP W/TINT 26ML (MISCELLANEOUS) ×2 IMPLANT
CLIP APPLIE 5 13 M/L LIGAMAX5 (MISCELLANEOUS) IMPLANT
CLIP APPLIE ROT 10 11.4 M/L (STAPLE) ×1 IMPLANT
CLOTH BEACON ORANGE TIMEOUT ST (SAFETY) ×2 IMPLANT
COVER MAYO STAND STRL (DRAPES) ×2 IMPLANT
COVER SURGICAL LIGHT HANDLE (MISCELLANEOUS) ×2 IMPLANT
DECANTER SPIKE VIAL GLASS SM (MISCELLANEOUS) ×4 IMPLANT
DERMABOND ADVANCED (GAUZE/BANDAGES/DRESSINGS) ×1
DERMABOND ADVANCED .7 DNX12 (GAUZE/BANDAGES/DRESSINGS) ×1 IMPLANT
DRAPE C-ARM 42X72 X-RAY (DRAPES) ×2 IMPLANT
DRAPE UTILITY 15X26 W/TAPE STR (DRAPE) ×4 IMPLANT
ELECT REM PT RETURN 9FT ADLT (ELECTROSURGICAL) ×2
ELECTRODE REM PT RTRN 9FT ADLT (ELECTROSURGICAL) ×1 IMPLANT
GLOVE BIO SURGEON STRL SZ7.5 (GLOVE) ×2 IMPLANT
GLOVE BIOGEL PI IND STRL 6.5 (GLOVE) ×1 IMPLANT
GLOVE BIOGEL PI IND STRL 7.5 (GLOVE) ×1 IMPLANT
GLOVE BIOGEL PI IND STRL 8 (GLOVE) ×3 IMPLANT
GLOVE BIOGEL PI INDICATOR 6.5 (GLOVE) ×1
GLOVE BIOGEL PI INDICATOR 7.5 (GLOVE) ×1
GLOVE BIOGEL PI INDICATOR 8 (GLOVE) ×3
GLOVE ECLIPSE 7.5 STRL STRAW (GLOVE) ×2 IMPLANT
GLOVE ECLIPSE 8.0 STRL XLNG CF (GLOVE) ×2 IMPLANT
GLOVE SURG SS PI 6.0 STRL IVOR (GLOVE) ×2 IMPLANT
GOWN STRL NON-REIN LRG LVL3 (GOWN DISPOSABLE) ×6 IMPLANT
GOWN STRL REIN XL XLG (GOWN DISPOSABLE) ×2 IMPLANT
HEMOSTAT SNOW SURGICEL 2X4 (HEMOSTASIS) ×4 IMPLANT
KIT BASIN OR (CUSTOM PROCEDURE TRAY) ×2 IMPLANT
KIT ROOM TURNOVER OR (KITS) ×2 IMPLANT
NS IRRIG 1000ML POUR BTL (IV SOLUTION) ×2 IMPLANT
PAD ARMBOARD 7.5X6 YLW CONV (MISCELLANEOUS) ×4 IMPLANT
PENCIL BUTTON HOLSTER BLD 10FT (ELECTRODE) ×2 IMPLANT
POUCH SPECIMEN RETRIEVAL 10MM (ENDOMECHANICALS) ×2 IMPLANT
SCISSORS LAP 5X35 DISP (ENDOMECHANICALS) IMPLANT
SET CHOLANGIOGRAPH 5 50 .035 (SET/KITS/TRAYS/PACK) ×2 IMPLANT
SET IRRIG TUBING LAPAROSCOPIC (IRRIGATION / IRRIGATOR) ×2 IMPLANT
SLEEVE ENDOPATH XCEL 5M (ENDOMECHANICALS) ×2 IMPLANT
SPECIMEN JAR SMALL (MISCELLANEOUS) ×2 IMPLANT
SUT MNCRL AB 4-0 PS2 18 (SUTURE) ×2 IMPLANT
TOWEL OR 17X24 6PK STRL BLUE (TOWEL DISPOSABLE) IMPLANT
TOWEL OR 17X26 10 PK STRL BLUE (TOWEL DISPOSABLE) ×2 IMPLANT
TOWEL OR NON WOVEN STRL DISP B (DISPOSABLE) ×4 IMPLANT
TRAY LAPAROSCOPIC (CUSTOM PROCEDURE TRAY) ×2 IMPLANT
TROCAR XCEL BLUNT TIP 100MML (ENDOMECHANICALS) ×2 IMPLANT
TROCAR XCEL NON-BLD 11X100MML (ENDOMECHANICALS) ×2 IMPLANT
TROCAR XCEL NON-BLD 5MMX100MML (ENDOMECHANICALS) ×2 IMPLANT
WATER STERILE IRR 1000ML POUR (IV SOLUTION) IMPLANT

## 2012-08-02 NOTE — ED Notes (Signed)
Pt returned from US

## 2012-08-02 NOTE — Anesthesia Preprocedure Evaluation (Addendum)
Anesthesia Evaluation  Patient identified by MRN, date of birth, ID band Patient awake    Reviewed: Allergy & Precautions, H&P , NPO status , Patient's Chart, lab work & pertinent test results  History of Anesthesia Complications Negative for: history of anesthetic complications  Airway Mallampati: II TM Distance: >3 FB Neck ROM: Full    Dental  (+) Edentulous Upper, Edentulous Lower and Dental Advisory Given   Pulmonary neg pulmonary ROS,  breath sounds clear to auscultation        Cardiovascular hypertension, Pt. on medications Rhythm:Regular Rate:Normal     Neuro/Psych negative neurological ROS  negative psych ROS   GI/Hepatic Neg liver ROS, GERD-  Poorly Controlled,Acute cholelithiasis    Endo/Other    Renal/GU negative Renal ROS     Musculoskeletal negative musculoskeletal ROS (+)   Abdominal   Peds  Hematology Thrombocytopenic 79K Chronic psoriasis--long term steroid use    Anesthesia Other Findings   Reproductive/Obstetrics                         Anesthesia Physical Anesthesia Plan  ASA: II  Anesthesia Plan: General   Post-op Pain Management:    Induction: Intravenous, Rapid sequence and Cricoid pressure planned  Airway Management Planned: Oral ETT  Additional Equipment:   Intra-op Plan:   Post-operative Plan: Extubation in OR  Informed Consent: I have reviewed the patients History and Physical, chart, labs and discussed the procedure including the risks, benefits and alternatives for the proposed anesthesia with the patient or authorized representative who has indicated his/her understanding and acceptance.   Dental advisory given  Plan Discussed with: CRNA and Anesthesiologist  Anesthesia Plan Comments: (Symptomatic cholelithiasis, possible CBD stone htn  Plan GA with oral ETT  Kipp Brood, MD)      Anesthesia Quick Evaluation

## 2012-08-02 NOTE — Anesthesia Procedure Notes (Signed)
Procedure Name: Intubation Date/Time: 08/02/2012 12:11 PM Performed by: Leona Singleton A Pre-anesthesia Checklist: Patient identified, Emergency Drugs available, Suction available and Patient being monitored Patient Re-evaluated:Patient Re-evaluated prior to inductionOxygen Delivery Method: Circle system utilized Preoxygenation: Pre-oxygenation with 100% oxygen Intubation Type: IV induction Ventilation: Mask ventilation without difficulty and Oral airway inserted - appropriate to patient size Laryngoscope Size: Hyacinth Meeker and 2 Grade View: Grade I Tube type: Oral Tube size: 7.5 mm Number of attempts: 1 Airway Equipment and Method: Stylet and LTA kit utilized Placement Confirmation: ETT inserted through vocal cords under direct vision,  positive ETCO2 and breath sounds checked- equal and bilateral Secured at: 21 cm Tube secured with: Tape Dental Injury: Teeth and Oropharynx as per pre-operative assessment

## 2012-08-02 NOTE — Op Note (Signed)
OPERATIVE REPORT  DATE OF OPERATION: 08/01/2012 - 08/02/2012  PATIENT:  Eric Maynard  72 y.o. male  PRE-OPERATIVE DIAGNOSIS:  Symptomatic cholelithiasis; Biliary colic; cholecystitis  POST-OPERATIVE DIAGNOSIS:  Symptomatic cholelithiasis; Biliary colic; cholecystitis  PROCEDURE:  Procedure(s): LAPAROSCOPIC CHOLECYSTECTOMY WITH INTRAOPERATIVE CHOLANGIOGRAM  SURGEON:  Surgeon(s): Cherylynn Ridges, MD Ardeth Sportsman, MD  ASSISTANT: Michaell Cowing  ANESTHESIA:   general  EBL: <75 ml  BLOOD ADMINISTERED: none  DRAINS: none   SPECIMEN:  Source of Specimen:  Gallbladder with stones  COUNTS CORRECT:  YES  PROCEDURE DETAILS: The patient was taken to the operating room and placed on the table in the supine position.  After an adequate endotracheal anesthetic was administered, he was prepped with ChloroPrep, and then draped in the usual manner exposing the entire abdomen laterally, inferiorly and up  to the costal margins.  After a proper timeout was performed including identifying the patient and the procedure to be performed, a supra-umbilical 1.5cm midline incision was made using a #15 blade.  This was taken down to the fascia which was then incised with a #15 blade.  The edges of the fascia were tented up with Kocher clamps as the preperitoneal space was penetrated with a Kelly clamp into the peritoneum.  Once this was done, a pursestring suture of 0 Vicryl was passed around the fascial opening.  This was subsequently used to secure the [facility] cannula which was passed into the peritoneal cavity.  Once the [facility] cannula was in place, carbon dioxide gas was insufflated into the peritoneal cavity up to a maximal intra-abdominal pressure of 15mm Hg.The laparoscope, with attached camera and light source, was passed into the peritoneal cavity to visualize the direct insertion of two right upper quadrant 5mm cannulas, and a sup-xiphoid 10mm cannula.  Once all cannulas were in place, the  dissection was begun.  It was noted immediately that the patient had significant cirrhosis of liver. The gallbladder was tense and had to be decompressed within a Najhat decompressive needle.  Two ratcheted graspers were attached to the dome and infundibulum of the gallbladder and retracted towards the anterior abdominal wall and the right upper quadrant.  Using cautery attached to a dissecting forceps, the peritoneum overlaying the triangle of Chalot and the hepatoduodenal triangle was dissected away exposing the cystic duct and the cystic artery.  A clip was placed on the gallbladder side of the cystic duct, then a cholecytodochotomy made using the laparoscopic scissors.  Through the cholecystodochotomy a Cook catheter was passed to performed a cholangiogram.  The cholangiogram showed good flow into the duodenum, or proximal filling, no intraductal filling defects, and no dilatation..  Once the cholangiogram was completed, the [facility] catheter was removed, and the distal cystic duct was clipped multiple times then transected.  The gallbladder was then dissected out of the hepatic bed without event.  It was retrieved from the abdomen using an EndoCatch bag without event.  Once the gallbladder was removed, the bed was inspected for hemostasis.  A significant amount of time was spent trying to attain adequate hemostasis because of the friability of the liver surface. Surgicel snow had to be applied to the gallbladder bed  .Once excellent hemostasis was obtained all gas and fluids were aspirated from above the liver, then the cannulas were removed.  The supra-umbilical incision was closed using the pursestring suture which was in place.  0.25% bupivicaine with epinephrine was injected at all sites.  All 10mm or greater cannula sites were close using  a running subcuticular stitch of 4-0 Monocryl.  5.72mm cannula sites were closed with Dermabond only.Steri-Strips and Tagaderm were used to complete the dressings at  all sites.  At this point all needle, sponge, and instrument counts were correct.The patient was awakened from anesthesia and taken to the PACU in stable condition.  PATIENT DISPOSITION:  PACU - hemodynamically stable.   Cherylynn Ridges 7/4/20141:52 PM

## 2012-08-02 NOTE — Anesthesia Postprocedure Evaluation (Signed)
  Anesthesia Post-op Note  Patient: Eric Maynard  Procedure(s) Performed: Procedure(s): LAPAROSCOPIC CHOLECYSTECTOMY WITH INTRAOPERATIVE CHOLANGIOGRAM (N/A)  Patient Location: PACU  Anesthesia Type:General  Level of Consciousness: awake, alert  and oriented  Airway and Oxygen Therapy: Patient Spontanous Breathing  Post-op Pain: mild  Post-op Assessment: Post-op Vital signs reviewed, Patient's Cardiovascular Status Stable, Respiratory Function Stable, Patent Airway and Pain level controlled  Post-op Vital Signs: stable  Complications: No apparent anesthesia complications

## 2012-08-02 NOTE — Progress Notes (Signed)
UR completed 

## 2012-08-02 NOTE — H&P (Signed)
Eric Maynard is an 72 y.o. male.   Chief Complaint: Right upper quadrant abdominal pain HPI: Patient developed right upper quadrant abdominal pain yesterday morning. He was seen in urgent care. He underwent CT scan of the abdomen and pelvis. Was diagnosed with biliary colic and discharged home. The pain persisted so he returned with his family to the emergency department for further evaluation. Ultrasound was done demonstrating gallstones but no evidence of cholecystitis. He has persistent pain and I was asked to see him for admission.  Past Medical History  Diagnosis Date  . Hypertension   . Kidney stone   . Cataract     Past Surgical History  Procedure Laterality Date  . Wrist surgery    . Eye surgery      No family history on file. Social History:  reports that he has never smoked. He does not have any smokeless tobacco history on file. He reports that  drinks alcohol. He reports that he does not use illicit drugs.  Allergies: No Known Allergies   (Not in a hospital admission)  Results for orders placed during the hospital encounter of 08/01/12 (from the past 48 hour(s))  CBC WITH DIFFERENTIAL     Status: Abnormal   Collection Time    08/01/12 10:42 PM      Result Value Range   WBC 9.5  4.0 - 10.5 K/uL   RBC 3.48 (*) 4.22 - 5.81 MIL/uL   Hemoglobin 12.2 (*) 13.0 - 17.0 g/dL   HCT 16.1 (*) 09.6 - 04.5 %   MCV 103.4 (*) 78.0 - 100.0 fL   MCH 35.1 (*) 26.0 - 34.0 pg   MCHC 33.9  30.0 - 36.0 g/dL   RDW 40.9  81.1 - 91.4 %   Platelets 79 (*) 150 - 400 K/uL   Comment: PLATELET COUNT CONFIRMED BY SMEAR   Neutrophils Relative % 71  43 - 77 %   Neutro Abs 6.8  1.7 - 7.7 K/uL   Lymphocytes Relative 13  12 - 46 %   Lymphs Abs 1.3  0.7 - 4.0 K/uL   Monocytes Relative 12  3 - 12 %   Monocytes Absolute 1.1 (*) 0.1 - 1.0 K/uL   Eosinophils Relative 3  0 - 5 %   Eosinophils Absolute 0.3  0.0 - 0.7 K/uL   Basophils Relative 1  0 - 1 %   Basophils Absolute 0.1  0.0  - 0.1 K/uL  COMPREHENSIVE METABOLIC PANEL     Status: Abnormal   Collection Time    08/01/12 10:42 PM      Result Value Range   Sodium 130 (*) 135 - 145 mEq/L   Potassium 3.9  3.5 - 5.1 mEq/L   Chloride 96  96 - 112 mEq/L   CO2 25  19 - 32 mEq/L   Glucose, Bld 207 (*) 70 - 99 mg/dL   BUN 16  6 - 23 mg/dL   Creatinine, Ser 7.82  0.50 - 1.35 mg/dL   Calcium 8.9  8.4 - 95.6 mg/dL   Total Protein 7.6  6.0 - 8.3 g/dL   Albumin 3.4 (*) 3.5 - 5.2 g/dL   AST 24  0 - 37 U/L   ALT 20  0 - 53 U/L   Alkaline Phosphatase 185 (*) 39 - 117 U/L   Total Bilirubin 1.0  0.3 - 1.2 mg/dL   GFR calc non Af Amer 62 (*) >90 mL/min   GFR calc Af Amer 72 (*) >90  mL/min   Comment:            The eGFR has been calculated     using the CKD EPI equation.     This calculation has not been     validated in all clinical     situations.     eGFR's persistently     <90 mL/min signify     possible Chronic Kidney Disease.  LIPASE, BLOOD     Status: None   Collection Time    08/01/12 10:42 PM      Result Value Range   Lipase 44  11 - 59 U/L   Ct Abdomen Pelvis Wo Contrast  08/01/2012   *RADIOLOGY REPORT*  Clinical Data: Right flank pain  CT ABDOMEN AND PELVIS WITHOUT CONTRAST  Technique:  Multidetector CT imaging of the abdomen and pelvis was performed following the standard protocol without intravenous contrast.  Comparison: None.  Findings: Lung bases are unremarkable.  Sagittal images of the spine shows multilevel mild generative changes.  Disc space flattening with endplate sclerotic changes and posterior spurring at L5-S1 level.  Atherosclerotic calcifications of the abdominal aorta and the iliac arteries.  Unenhanced liver shows no biliary ductal dilatation.  There is a distended gallbladder.  No thickening of gallbladder wall.  At least one gallstone is noted within gallbladder measures 1.5 cm.  Small hiatal hernia.  Unenhanced pancreas, spleen and adrenals are unremarkable.  Unenhanced kidneys are symmetrical  in size. Multiple bilateral nonobstructive renal calcifications are noted. There are at least four nonobstructive calcified calculi within right kidney largest measures 3.6 mm.  At least four or five calcified calculi are noted within the left kidney nonobstructive the largest in mid pole measures 4.8 mm.  There is a probable cyst in mid pole of the right kidney measures 1.2 cm.  There is lobulated contour of the left kidney.    Small high density exophytic lesion in mid pole measures 5 mm.  Further evaluation with enhanced CT or MRI is recommended.  No hydronephrosis or hydroureter.  No calcified ureteral calculi. Nonspecific mild distention of the jejunum without surrounding inflammatory changes.  Moderate colonic stool.  No pericecal inflammation.  The terminal ileum is unremarkable.  Normal appendix partially visualized in axial image 61.  Left colon and sigmoid colon diverticula are noted.  No evidence of acute diverticulitis.  Nonspecific mild thickening the urinary bladder wall.  Prostate gland measures 2.9 x 4 cm.  IMPRESSION:  1.  No hydronephrosis or hydroureter.  Bilateral nonobstructive nephrolithiasis.  Small high density exophytic lesion mid pole of the left kidney measures 5 mm.  Further evaluation with enhanced CT or MRI is recommended. 2.  Probable right renal cyst measures 1.2 cm.  No calcified ureteral calculi. 3.  Normal appendix.  No pericecal inflammation.  4.  Left colon and sigmoid colon diverticula.  No evidence of acute diverticulitis. 5.  Distended gallbladder.  No evidence of pericholecystic fluid. Gallstone within gallbladder measures 1.5 cm. 6.  Nonspecific mild thickening of urinary bladder wall.   Original Report Authenticated By: Natasha Mead, M.D.   US Abdomen Complete  08/02/2012   *RADIOLOGY REPORT*  Clinical Data:  Right upper quadrant pain.  Gallstones.  COMPLETE ABDOMINAL ULTRASOUND  Comparison:  CT 08/01/2012.  Findings:  Gallbladder:  17 mm gallstone is present.  Stone is  mobile.  No sonographic Murphy's sign.  No wall thickening.  Common bile duct:  5 mm, normal.  Liver:  No focal mass lesion identified.  Coarse  echotexture.  IVC:  Appears normal.  Pancreas:  Obscured by overlying bowel gas.  Spleen:  Borderline size.  Right Kidney:  10.7 cm.  19 mm interpolar cyst.  No obstruction or hydronephrosis. Calculi identified on CT not well seen on their ultrasound.  Left Kidney:  Persistent fetal lobation.10.9 cm.  19 mm inferior pole cyst.  The hyperdense lesions seen on prior CT is not visualized by ultrasound.No hydronephrosis.  Abdominal aorta:  No aneurysm identified.  IMPRESSION:  1.  Cholelithiasis without cholecystitis. 2.  Previously demonstrated renal calculi not resolved on ultrasound. 3.  Bilateral renal cysts identified. 4.  5 mm high density lesions seen on prior CT in the interpolar left kidney not visualized.  This may be isoechoic to the kidney. Consider follow-up renal MRI with and without contrast for further assessment. Non-emergent MRI should be deferred until patient has been discharged for the acute illness, and can optimally cooperate with positioning and breath-holding instructions.   Original Report Authenticated By: Andreas Newport, M.D.    Review of Systems  Constitutional: Negative.   HENT: Negative.   Eyes: Negative.   Respiratory: Negative.   Cardiovascular: Negative.   Gastrointestinal: Positive for abdominal pain. Negative for nausea, vomiting and blood in stool.  Genitourinary: Negative.   Musculoskeletal: Negative.   Skin:       psoriasis  Neurological: Negative.   Endo/Heme/Allergies: Negative.   Psychiatric/Behavioral: Negative.     Blood pressure 137/63, pulse 69, temperature 97.5 F (36.4 C), temperature source Oral, resp. rate 14, SpO2 96.00%. Physical Exam  Constitutional: He is oriented to person, place, and time. He appears well-developed and well-nourished. No distress.  HENT:  Head: Normocephalic and atraumatic.   Mouth/Throat: No oropharyngeal exudate.  Eyes: EOM are normal. Pupils are equal, round, and reactive to light.  Neck: Normal range of motion. Neck supple. No tracheal deviation present. No thyromegaly present.  Cardiovascular: Normal rate, regular rhythm, normal heart sounds and intact distal pulses.   Respiratory: Effort normal and breath sounds normal. No stridor. No respiratory distress. He has no wheezes. He has no rales.  GI: Soft. He exhibits no distension and no mass. There is tenderness. There is no rebound and no guarding.  Mild tenderness right upper quadrant  Musculoskeletal: Normal range of motion. He exhibits no edema and no tenderness.  Neurological: He is oriented to person, place, and time. He exhibits normal muscle tone.  Skin: Skin is warm and dry.  Psychiatric: He has a normal mood and affect.     Assessment/Plan Symptomatic cholelithiasis, mild hyponatremia, mild thrombocytopenia. Patient's pain is persistent and may represent early cholecystitis. Will admit, give IV antibiotics, bowel rest, and we will plan laparoscopic cholecystectomy this admission, possibly later today. I will discuss him with my partners. Plan was discussed in detail with the patient and his family in [place] and [place].  Kedar Sedano E 08/02/2012, 2:12 AM

## 2012-08-02 NOTE — ED Notes (Signed)
Pt transported to US

## 2012-08-02 NOTE — Progress Notes (Signed)
The patient's symptoms have subsided, but he has received pain medications.  Will go ahead with surgery today.  Mild thrombocytopenia at 79K.  Takes meds for chronic psoriasis. For laparoscopic cholecystectomy today.  Eric Maynard. Gae Bon, MD, FACS [phone] [facility]

## 2012-08-02 NOTE — Preoperative (Signed)
Beta Blockers   Reason not to administer Beta Blockers:Not Applicable 

## 2012-08-02 NOTE — Transfer of Care (Signed)
Immediate Anesthesia Transfer of Care Note  Patient: Eric Maynard  Procedure(s) Performed: Procedure(s): LAPAROSCOPIC CHOLECYSTECTOMY WITH INTRAOPERATIVE CHOLANGIOGRAM (N/A)  Patient Location: PACU  Anesthesia Type:General  Level of Consciousness: awake and patient cooperative  Airway & Oxygen Therapy: Patient Spontanous Breathing and Patient connected to nasal cannula oxygen  Post-op Assessment: Report given to PACU RN, Post -op Vital signs reviewed and stable and Patient moving all extremities X 4  Post vital signs: Reviewed and stable  Complications: No apparent anesthesia complications

## 2012-08-03 DIAGNOSIS — K8020 Calculus of gallbladder without cholecystitis without obstruction: Secondary | ICD-10-CM | POA: Diagnosis present

## 2012-08-03 DIAGNOSIS — K7460 Unspecified cirrhosis of liver: Secondary | ICD-10-CM | POA: Diagnosis present

## 2012-08-03 LAB — COMPREHENSIVE METABOLIC PANEL
ALT: 30 U/L (ref 0–53)
Albumin: 2.7 g/dL — ABNORMAL LOW (ref 3.5–5.2)
Alkaline Phosphatase: 134 U/L — ABNORMAL HIGH (ref 39–117)
Calcium: 8.3 mg/dL — ABNORMAL LOW (ref 8.4–10.5)
Potassium: 4.3 mEq/L (ref 3.5–5.1)
Sodium: 132 mEq/L — ABNORMAL LOW (ref 135–145)
Total Protein: 6.3 g/dL (ref 6.0–8.3)

## 2012-08-03 LAB — CBC
MCH: 35.5 pg — ABNORMAL HIGH (ref 26.0–34.0)
MCHC: 33.9 g/dL (ref 30.0–36.0)
RDW: 15 % (ref 11.5–15.5)

## 2012-08-03 LAB — PROTIME-INR
INR: 1.37 (ref 0.00–1.49)
Prothrombin Time: 16.5 seconds — ABNORMAL HIGH (ref 11.6–15.2)

## 2012-08-03 MED ORDER — OXYCODONE HCL 5 MG PO TABS: 5.0000 mg | ORAL_TABLET | ORAL | Status: DC | PRN

## 2012-08-03 NOTE — Discharge Summary (Signed)
Physician Discharge Summary  Patient ID: Eric Maynard MRN: [id number] DOB/AGE: 1940/11/26 72 y.o.  Admit date: 08/01/2012 Discharge date: 08/03/2012  Admission Diagnoses:  Symptomatic cholelithiasis  Discharge Diagnoses:  Principal Problem:   Cholelithiasis Active Problems:   Cirrhosis of liver without mention of alcohol  Thrombocytopenia  Hypertension  Psoriasis   Discharged Condition: good  Hospital Course: He was admitted. He was taken to the operating room and underwent laparoscopic cholecystectomy with cholangiogram.  The cholangiogram was normal.  Intraoperatively he was noted to have cirrhosis. Postoperatively he did well. He was tolerating his diet. Ambulating. He had no evidence of complications from the surgery. The finding of cirrhosis was explained to him and his family. Further evaluation will be deferred to his primary care physician. He was instructed to avoid drinking alcohol and cannot take Tylenol or any Tylenol like products. He was given discharge instructions and discharged on his first postoperative day.  Consults: None  Significant Diagnostic Studies: none  Treatments: surgery: Laparoscopic cholecystectomy with cholangiogram 08/02/2012  Discharge Exam: Blood pressure 136/70, pulse 86, temperature 98.3 F (36.8 C), temperature source Oral, resp. rate 16, height 5\' 8"  (1.727 m), weight 162 lb 4.1 oz (73.6 kg), SpO2 96.00%. His abdomen was soft, his dressings were dry.  Disposition: 01-Home or Self Care     Medication List    STOP taking these medications       oxyCODONE-acetaminophen 5-325 MG per tablet  Commonly known as:  PERCOCET      TAKE these medications       amLODipine 5 MG tablet  Commonly known as:  NORVASC  Take 5 mg by mouth daily.     docusate sodium 100 MG capsule  Commonly known as:  COLACE  Take 1 capsule (100 mg total) by mouth every 12 (twelve) hours.     folic acid 400 MCG tablet  Commonly known as:  FOLVITE   Take 400 mcg by mouth daily.     methotrexate 2.5 MG tablet  Commonly known as:  RHEUMATREX  Take 17.5 mg by mouth once a week. Caution:Chemotherapy. Protect from light.     ondansetron 8 MG disintegrating tablet  Commonly known as:  ZOFRAN-ODT  Take 8 mg by mouth every 8 (eight) hours as needed for nausea.     oxyCODONE 5 MG immediate release tablet  Commonly known as:  Oxy IR/ROXICODONE  Take 1-2 tablets (5-10 mg total) by mouth every 4 (four) hours as needed for pain.         Signed: Adolph Pollack 08/03/2012, 11:02 AM

## 2012-08-03 NOTE — Progress Notes (Signed)
1 Day Post-Op  Subjective: Feels good.  Tolerating diet.  Objective: Vital signs in last 24 hours: Temp:  [97.5 F (36.4 C)-98.3 F (36.8 C)] 98.3 F (36.8 C) (07/05 1010) Pulse Rate:  [70-88] 86 (07/05 1010) Resp:  [11-17] 16 (07/05 1010) BP: (111-165)/(66-82) 136/70 mmHg (07/05 1010) SpO2:  [95 %-99 %] 96 % (07/05 1010) FiO2 (%):  [2 %] 2 % (07/04 1501) Last BM Date: 08/01/12  Intake/Output from previous day: 07/04 0701 - 07/05 0700 In: 3247.5 [P.O.:800; I.V.:2447.5] Out: 50 [Blood:50] Intake/Output this shift: Total I/O In: 360 [P.O.:360] Out: -   PE: General- In NAD Abdomen-soft, dressings dry  Lab Results:   Recent Labs  08/02/12 1602 08/03/12 0550  WBC 9.2 13.5*  HGB 12.4* 11.1*  HCT 36.2* 32.7*  PLT 78* 72*   BMET  Recent Labs  08/02/12 1602 08/03/12 0550  NA 133* 132*  K 3.7 4.3  CL 101 100  CO2 23 22  GLUCOSE 222* 268*  BUN 10 12  CREATININE 1.03 0.98  CALCIUM 8.5 8.3*   PT/INR  Recent Labs  08/03/12 0550  LABPROT 16.5*  INR 1.37   Comprehensive Metabolic Panel:    Component Value Date/Time   NA 132* 08/03/2012 0550   K 4.3 08/03/2012 0550   CL 100 08/03/2012 0550   CO2 22 08/03/2012 0550   BUN 12 08/03/2012 0550   CREATININE 0.98 08/03/2012 0550   GLUCOSE 268* 08/03/2012 0550   GLUCOSE 112* 12/15/2005 1345   CALCIUM 8.3* 08/03/2012 0550   AST 42* 08/03/2012 0550   ALT 30 08/03/2012 0550   ALKPHOS 134* 08/03/2012 0550   BILITOT 0.9 08/03/2012 0550   PROT 6.3 08/03/2012 0550   ALBUMIN 2.7* 08/03/2012 0550     Studies/Results: Dg Cholangiogram Operative  08/02/2012   *RADIOLOGY REPORT*  Clinical Data: Cholelithiasis and cholecystitis.  Fluoroscopy time:  8 seconds  INTRAOPERATIVE CHOLANGIOGRAM  Technique:  Multiple fluoroscopic spot radiographs were obtained during intraoperative cholangiogram and are submitted for interpretation post-operatively.  Comparison: Ultrasound dated 08/02/2012  Findings: Common hepatic and common bile duct are normal.  Free  flow of contrast into the duodenum.  The visualized intrahepatic ducts are normal.  IMPRESSION: Normal intraoperative cholangiogram.   Original Report Authenticated By: Francene Boyers, M.D.   US Abdomen Complete  08/02/2012   *RADIOLOGY REPORT*  Clinical Data:  Right upper quadrant pain.  Gallstones.  COMPLETE ABDOMINAL ULTRASOUND  Comparison:  CT 08/01/2012.  Findings:  Gallbladder:  17 mm gallstone is present.  Stone is mobile.  No sonographic Murphy's sign.  No wall thickening.  Common bile duct:  5 mm, normal.  Liver:  No focal mass lesion identified.  Coarse echotexture.  IVC:  Appears normal.  Pancreas:  Obscured by overlying bowel gas.  Spleen:  Borderline size.  Right Kidney:  10.7 cm.  19 mm interpolar cyst.  No obstruction or hydronephrosis. Calculi identified on CT not well seen on their ultrasound.  Left Kidney:  Persistent fetal lobation.10.9 cm.  19 mm inferior pole cyst.  The hyperdense lesions seen on prior CT is not visualized by ultrasound.No hydronephrosis.  Abdominal aorta:  No aneurysm identified.  IMPRESSION:  1.  Cholelithiasis without cholecystitis. 2.  Previously demonstrated renal calculi not resolved on ultrasound. 3.  Bilateral renal cysts identified. 4.  5 mm high density lesions seen on prior CT in the interpolar left kidney not visualized.  This may be isoechoic to the kidney. Consider follow-up renal MRI with and without contrast for further  assessment. Non-emergent MRI should be deferred until patient has been discharged for the acute illness, and can optimally cooperate with positioning and breath-holding instructions.   Original Report Authenticated By: Andreas Newport, M.D.    Anti-infectives: Anti-infectives   Start     Dose/Rate Route Frequency Ordered Stop   08/02/12 0400  ciprofloxacin (CIPRO) IVPB 400 mg  Status:  Discontinued     400 mg 200 mL/hr over 60 Minutes Intravenous Every 12 hours 08/02/12 0345 08/02/12 1510      Assessment Principal Problem:    Symptomatic Cholelithiasis s/p lap chole 08/02/12-doing well post op Active Problems:   Cirrhosis of liver-discussed with him and his family; he denies heavy alcohol use or hepatitis    LOS: 2 days   Plan: Discharge today.  Instructions given.  He will to see his primary MD (Dr. Luiz Iron) for further evaluation of his cirrhosis.   Adisen Bennion J 08/03/2012

## 2012-08-05 ENCOUNTER — Encounter (HOSPITAL_COMMUNITY): Payer: Self-pay | Admitting: General Surgery

## 2012-08-12 ENCOUNTER — Telehealth (INDEPENDENT_AMBULATORY_CARE_PROVIDER_SITE_OTHER): Payer: Self-pay

## 2012-08-12 NOTE — Telephone Encounter (Signed)
The patient's son called to schedule a 2 wk f/u s/p lap chole by Dr Lindie Spruce.  Please call.

## 2012-08-13 ENCOUNTER — Encounter (INDEPENDENT_AMBULATORY_CARE_PROVIDER_SITE_OTHER): Payer: Self-pay

## 2012-08-13 NOTE — Telephone Encounter (Signed)
Gave patients son a follow up appt for 7/29 and told him I will fax a work note for him to go back on 7/21.

## 2012-08-27 ENCOUNTER — Encounter (INDEPENDENT_AMBULATORY_CARE_PROVIDER_SITE_OTHER): Payer: Self-pay

## 2012-08-27 ENCOUNTER — Ambulatory Visit (INDEPENDENT_AMBULATORY_CARE_PROVIDER_SITE_OTHER): Payer: Medicare Other | Admitting: General Surgery

## 2012-08-27 ENCOUNTER — Encounter (INDEPENDENT_AMBULATORY_CARE_PROVIDER_SITE_OTHER): Payer: Self-pay | Admitting: General Surgery

## 2012-08-27 VITALS — BP 142/56 | HR 80 | Resp 16 | Ht 69.0 in | Wt 163.4 lb

## 2012-08-27 DIAGNOSIS — Z09 Encounter for follow-up examination after completed treatment for conditions other than malignant neoplasm: Secondary | ICD-10-CM

## 2012-08-27 NOTE — Progress Notes (Signed)
The patient comes in today status post laparoscopic cholecystectomy. He is doing well from a surgical standpoint however he has developed significant psoriasis on his abdominal wall and on his back. This is done primarily because he has been taken off of his methotrexate because of the liver changes that were noted at the time of surgery.  He is doing well from a GI standpoint. He is eating well, going to the restroom well, and has no fevers or chills and no jaundice. From my standpoint he can go back to work. He is going to start new treatment for his psoriasis however he had been on methotrexate for 20+ years. His return to see me is on a when necessary basis.

## 2012-09-04 ENCOUNTER — Other Ambulatory Visit: Payer: Self-pay

## 2012-12-05 ENCOUNTER — Other Ambulatory Visit: Payer: Self-pay

## 2013-02-13 ENCOUNTER — Encounter (HOSPITAL_BASED_OUTPATIENT_CLINIC_OR_DEPARTMENT_OTHER): Payer: Self-pay | Admitting: Emergency Medicine

## 2013-02-13 ENCOUNTER — Encounter (HOSPITAL_COMMUNITY)
Admission: EM | Disposition: A | Discharge status: Home / Self Care | Payer: Self-pay | Source: Home / Self Care | Attending: Emergency Medicine

## 2013-02-13 ENCOUNTER — Emergency Department (HOSPITAL_COMMUNITY): Payer: Medicare Other | Admitting: Anesthesiology

## 2013-02-13 ENCOUNTER — Observation Stay (HOSPITAL_BASED_OUTPATIENT_CLINIC_OR_DEPARTMENT_OTHER)
Admission: EM | Admit: 2013-02-13 | Discharge: 2013-02-15 | Disposition: A | Discharge status: Home / Self Care | Payer: Medicare Other | Attending: Orthopedic Surgery | Admitting: Orthopedic Surgery

## 2013-02-13 ENCOUNTER — Emergency Department (HOSPITAL_BASED_OUTPATIENT_CLINIC_OR_DEPARTMENT_OTHER): Payer: Medicare Other

## 2013-02-13 ENCOUNTER — Encounter (HOSPITAL_COMMUNITY): Payer: Medicare Other | Admitting: Anesthesiology

## 2013-02-13 DIAGNOSIS — I10 Essential (primary) hypertension: Secondary | ICD-10-CM | POA: Insufficient documentation

## 2013-02-13 DIAGNOSIS — H269 Unspecified cataract: Secondary | ICD-10-CM | POA: Insufficient documentation

## 2013-02-13 DIAGNOSIS — S52209B Unspecified fracture of shaft of unspecified ulna, initial encounter for open fracture type I or II: Principal | ICD-10-CM | POA: Insufficient documentation

## 2013-02-13 DIAGNOSIS — Y9373 Activity, racquet and hand sports: Secondary | ICD-10-CM | POA: Insufficient documentation

## 2013-02-13 DIAGNOSIS — L408 Other psoriasis: Secondary | ICD-10-CM | POA: Insufficient documentation

## 2013-02-13 DIAGNOSIS — S52309B Unspecified fracture of shaft of unspecified radius, initial encounter for open fracture type I or II: Principal | ICD-10-CM | POA: Insufficient documentation

## 2013-02-13 DIAGNOSIS — S5291XB Unspecified fracture of right forearm, initial encounter for open fracture type I or II: Secondary | ICD-10-CM

## 2013-02-13 DIAGNOSIS — W219XXA Striking against or struck by unspecified sports equipment, initial encounter: Secondary | ICD-10-CM | POA: Insufficient documentation

## 2013-02-13 DIAGNOSIS — S52501B Unspecified fracture of the lower end of right radius, initial encounter for open fracture type I or II: Secondary | ICD-10-CM | POA: Diagnosis present

## 2013-02-13 HISTORY — PX: OPEN REDUCTION INTERNAL FIXATION (ORIF) DISTAL RADIAL FRACTURE: SHX5989

## 2013-02-13 HISTORY — DX: Psoriasis, unspecified: L40.9

## 2013-02-13 LAB — CBC WITH DIFFERENTIAL/PLATELET
BASOS ABS: 0.1 10*3/uL (ref 0.0–0.1)
Basophils Relative: 1 % (ref 0–1)
Eosinophils Absolute: 0.2 10*3/uL (ref 0.0–0.7)
Eosinophils Relative: 3 % (ref 0–5)
HEMATOCRIT: 36 % — AB (ref 39.0–52.0)
Hemoglobin: 12.3 g/dL — ABNORMAL LOW (ref 13.0–17.0)
Lymphocytes Relative: 31 % (ref 12–46)
Lymphs Abs: 1.8 10*3/uL (ref 0.7–4.0)
MCH: 34.5 pg — ABNORMAL HIGH (ref 26.0–34.0)
MCHC: 34.2 g/dL (ref 30.0–36.0)
MCV: 100.8 fL — ABNORMAL HIGH (ref 78.0–100.0)
Monocytes Absolute: 0.9 10*3/uL (ref 0.1–1.0)
Monocytes Relative: 16 % — ABNORMAL HIGH (ref 3–12)
NEUTROS ABS: 2.9 10*3/uL (ref 1.7–7.7)
Neutrophils Relative %: 49 % (ref 43–77)
PLATELETS: 84 10*3/uL — AB (ref 150–400)
RBC: 3.57 MIL/uL — ABNORMAL LOW (ref 4.22–5.81)
RDW: 12.6 % (ref 11.5–15.5)
WBC: 5.9 10*3/uL (ref 4.0–10.5)

## 2013-02-13 LAB — BASIC METABOLIC PANEL
BUN: 17 mg/dL (ref 6–23)
CHLORIDE: 100 meq/L (ref 96–112)
CO2: 22 mEq/L (ref 19–32)
Calcium: 9 mg/dL (ref 8.4–10.5)
Creatinine, Ser: 1.2 mg/dL (ref 0.50–1.35)
GFR calc Af Amer: 67 mL/min — ABNORMAL LOW (ref >90)
GFR calc non Af Amer: 58 mL/min — ABNORMAL LOW (ref >90)
Glucose, Bld: 157 mg/dL — ABNORMAL HIGH (ref 70–99)
Potassium: 3.6 mEq/L — ABNORMAL LOW (ref 3.7–5.3)
SODIUM: 138 meq/L (ref 137–147)

## 2013-02-13 SURGERY — OPEN REDUCTION INTERNAL FIXATION (ORIF) DISTAL RADIUS FRACTURE
Anesthesia: General | Site: Arm Lower | Laterality: Right

## 2013-02-13 MED ORDER — CEFAZOLIN SODIUM 1-5 GM-% IV SOLN
1.0000 g | Freq: Once | INTRAVENOUS | Status: AC
  Administered 2013-02-13: 1 g via INTRAVENOUS
  Filled 2013-02-13: qty 50

## 2013-02-13 MED ORDER — FENTANYL CITRATE 0.05 MG/ML IJ SOLN
INTRAMUSCULAR | Status: DC | PRN
  Administered 2013-02-13: 125 ug via INTRAVENOUS
  Administered 2013-02-13: 50 ug via INTRAVENOUS

## 2013-02-13 MED ORDER — EPHEDRINE SULFATE 50 MG/ML IJ SOLN
INTRAMUSCULAR | Status: DC | PRN
  Administered 2013-02-13 (×2): 5 mg via INTRAVENOUS
  Administered 2013-02-13: 10 mg via INTRAVENOUS

## 2013-02-13 MED ORDER — CEFAZOLIN SODIUM-DEXTROSE 2-3 GM-% IV SOLR
INTRAVENOUS | Status: AC
  Administered 2013-02-13: 2 g via INTRAVENOUS
  Filled 2013-02-13: qty 50

## 2013-02-13 MED ORDER — ONDANSETRON HCL 4 MG PO TABS: 4.0000 mg | ORAL_TABLET | Freq: Four times a day (QID) | ORAL | Status: DC | PRN

## 2013-02-13 MED ORDER — HYDROCODONE-ACETAMINOPHEN 5-325 MG PO TABS: 2.0000 | ORAL_TABLET | Freq: Once | ORAL | Status: DC

## 2013-02-13 MED ORDER — SODIUM CHLORIDE 0.9 % IV SOLN
Freq: Once | INTRAVENOUS | Status: AC
  Administered 2013-02-13: 16:00:00 via INTRAVENOUS

## 2013-02-13 MED ORDER — ONDANSETRON HCL 4 MG/2ML IJ SOLN
INTRAMUSCULAR | Status: DC | PRN
  Administered 2013-02-13: 4 mg via INTRAVENOUS

## 2013-02-13 MED ORDER — HYDROMORPHONE HCL PF 1 MG/ML IJ SOLN: 0.2500 mg | INTRAMUSCULAR | Status: DC | PRN

## 2013-02-13 MED ORDER — OXYCODONE HCL 5 MG/5ML PO SOLN: 5.0000 mg | Freq: Once | ORAL | Status: DC | PRN

## 2013-02-13 MED ORDER — MEPERIDINE HCL 25 MG/ML IJ SOLN: 6.2500 mg | INTRAMUSCULAR | Status: DC | PRN

## 2013-02-13 MED ORDER — DOCUSATE SODIUM 100 MG PO CAPS
100.0000 mg | ORAL_CAPSULE | Freq: Two times a day (BID) | ORAL | Status: DC
  Administered 2013-02-13 – 2013-02-15 (×4): 100 mg via ORAL
  Filled 2013-02-13 (×4): qty 1

## 2013-02-13 MED ORDER — LACTATED RINGERS IV SOLN
INTRAVENOUS | Status: DC
  Administered 2013-02-13 – 2013-02-14 (×2): via INTRAVENOUS

## 2013-02-13 MED ORDER — MORPHINE SULFATE 4 MG/ML IJ SOLN
4.0000 mg | Freq: Once | INTRAMUSCULAR | Status: AC
  Administered 2013-02-13: 4 mg via INTRAVENOUS
  Filled 2013-02-13: qty 1

## 2013-02-13 MED ORDER — PROMETHAZINE HCL 25 MG RE SUPP: 12.5000 mg | Freq: Four times a day (QID) | RECTAL | Status: DC | PRN

## 2013-02-13 MED ORDER — PROPOFOL 10 MG/ML IV BOLUS
INTRAVENOUS | Status: DC | PRN
  Administered 2013-02-13: 90 mg via INTRAVENOUS

## 2013-02-13 MED ORDER — LACTATED RINGERS IV SOLN
INTRAVENOUS | Status: DC | PRN
  Administered 2013-02-13 (×2): via INTRAVENOUS

## 2013-02-13 MED ORDER — ONDANSETRON HCL 4 MG/2ML IJ SOLN: 4.0000 mg | Freq: Four times a day (QID) | INTRAMUSCULAR | Status: DC | PRN

## 2013-02-13 MED ORDER — MORPHINE SULFATE 4 MG/ML IJ SOLN
4.0000 mg | Freq: Once | INTRAMUSCULAR | Status: AC
  Administered 2013-02-13: 4 mg via INTRAVENOUS

## 2013-02-13 MED ORDER — VITAMIN C 500 MG PO TABS
1000.0000 mg | ORAL_TABLET | Freq: Every day | ORAL | Status: DC
  Administered 2013-02-14 – 2013-02-15 (×2): 1000 mg via ORAL
  Filled 2013-02-13 (×2): qty 2

## 2013-02-13 MED ORDER — MORPHINE SULFATE 2 MG/ML IJ SOLN: 1.0000 mg | INTRAMUSCULAR | Status: DC | PRN

## 2013-02-13 MED ORDER — OXYCODONE HCL 5 MG PO TABS
5.0000 mg | ORAL_TABLET | ORAL | Status: DC | PRN
  Administered 2013-02-14 – 2013-02-15 (×7): 10 mg via ORAL
  Filled 2013-02-13 (×7): qty 2

## 2013-02-13 MED ORDER — LIDOCAINE HCL (CARDIAC) 20 MG/ML IV SOLN
INTRAVENOUS | Status: DC | PRN
  Administered 2013-02-13: 100 mg via INTRAVENOUS

## 2013-02-13 MED ORDER — 0.9 % SODIUM CHLORIDE (POUR BTL) OPTIME
TOPICAL | Status: DC | PRN
  Administered 2013-02-13: 1000 mL

## 2013-02-13 MED ORDER — OXYCODONE HCL 5 MG PO TABS: 5.0000 mg | ORAL_TABLET | Freq: Once | ORAL | Status: DC | PRN

## 2013-02-13 MED ORDER — ONDANSETRON HCL 4 MG/2ML IJ SOLN: 4.0000 mg | Freq: Once | INTRAMUSCULAR | Status: DC | PRN

## 2013-02-13 MED ORDER — AMLODIPINE BESYLATE 5 MG PO TABS
5.0000 mg | ORAL_TABLET | Freq: Every day | ORAL | Status: DC
  Administered 2013-02-14 – 2013-02-15 (×2): 5 mg via ORAL
  Filled 2013-02-13 (×2): qty 1

## 2013-02-13 MED ORDER — MORPHINE SULFATE 4 MG/ML IJ SOLN
INTRAMUSCULAR | Status: AC
  Administered 2013-02-13: 4 mg via INTRAVENOUS
  Filled 2013-02-13: qty 1

## 2013-02-13 MED ORDER — FAMOTIDINE 20 MG PO TABS
20.0000 mg | ORAL_TABLET | Freq: Two times a day (BID) | ORAL | Status: DC | PRN
  Filled 2013-02-13: qty 1

## 2013-02-13 MED ORDER — MIDAZOLAM HCL 5 MG/5ML IJ SOLN
INTRAMUSCULAR | Status: DC | PRN
  Administered 2013-02-13: 1 mg via INTRAVENOUS

## 2013-02-13 MED ORDER — CEFAZOLIN SODIUM 1-5 GM-% IV SOLN
1.0000 g | Freq: Three times a day (TID) | INTRAVENOUS | Status: DC
  Administered 2013-02-13 – 2013-02-15 (×6): 1 g via INTRAVENOUS
  Filled 2013-02-13 (×8): qty 50

## 2013-02-13 MED ORDER — ALPRAZOLAM 0.5 MG PO TABS
0.5000 mg | ORAL_TABLET | Freq: Four times a day (QID) | ORAL | Status: DC | PRN
Start: 2013-02-13 — End: 2013-02-15
  Administered 2013-02-14: 0.5 mg via ORAL
  Filled 2013-02-13: qty 1

## 2013-02-13 MED ORDER — BUPIVACAINE-EPINEPHRINE PF 0.5-1:200000 % IJ SOLN
INTRAMUSCULAR | Status: DC | PRN
  Administered 2013-02-13: 30 mL via PERINEURAL

## 2013-02-13 MED ORDER — BUPIVACAINE HCL (PF) 0.25 % IJ SOLN
INTRAMUSCULAR | Status: AC
  Filled 2013-02-13: qty 30

## 2013-02-13 MED ORDER — SUCCINYLCHOLINE CHLORIDE 20 MG/ML IJ SOLN
INTRAMUSCULAR | Status: DC | PRN
  Administered 2013-02-13: 100 mg via INTRAVENOUS

## 2013-02-13 MED ORDER — HYDROCODONE-ACETAMINOPHEN 5-325 MG PO TABS
ORAL_TABLET | ORAL | Status: AC
  Filled 2013-02-13: qty 2

## 2013-02-13 SURGICAL SUPPLY — 64 items
BANDAGE ELASTIC 3 VELCRO ST LF (GAUZE/BANDAGES/DRESSINGS) ×3 IMPLANT
BANDAGE ELASTIC 4 VELCRO ST LF (GAUZE/BANDAGES/DRESSINGS) ×3 IMPLANT
BANDAGE GAUZE ELAST BULKY 4 IN (GAUZE/BANDAGES/DRESSINGS) ×6 IMPLANT
BIT DRILL 2 FAST STEP (BIT) ×3 IMPLANT
BIT DRILL 2.5X4 QC (BIT) ×3 IMPLANT
BLADE 15 SAFETY STRL DISP (BLADE) ×6 IMPLANT
BLADE SURG ROTATE 9660 (MISCELLANEOUS) IMPLANT
BNDG ESMARK 4X9 LF (GAUZE/BANDAGES/DRESSINGS) ×3 IMPLANT
CLOTH BEACON ORANGE TIMEOUT ST (SAFETY) IMPLANT
CORDS BIPOLAR (ELECTRODE) ×3 IMPLANT
COVER SURGICAL LIGHT HANDLE (MISCELLANEOUS) ×3 IMPLANT
CUFF TOURNIQUET SINGLE 18IN (TOURNIQUET CUFF) ×3 IMPLANT
CUFF TOURNIQUET SINGLE 24IN (TOURNIQUET CUFF) IMPLANT
DECANTER SPIKE VIAL GLASS SM (MISCELLANEOUS) IMPLANT
DRAIN TLS ROUND 10FR (DRAIN) IMPLANT
DRAPE OEC MINIVIEW 54X84 (DRAPES) ×3 IMPLANT
DRAPE U-SHAPE 47X51 STRL (DRAPES) ×3 IMPLANT
DRSG ADAPTIC 3X8 NADH LF (GAUZE/BANDAGES/DRESSINGS) ×3 IMPLANT
GAUZE XEROFORM 1X8 LF (GAUZE/BANDAGES/DRESSINGS) IMPLANT
GAUZE XEROFORM 5X9 LF (GAUZE/BANDAGES/DRESSINGS) ×3 IMPLANT
GLOVE BIOGEL PI IND STRL 7.5 (GLOVE) ×1 IMPLANT
GLOVE BIOGEL PI INDICATOR 7.5 (GLOVE) ×2
GLOVE ORTHO TXT STRL SZ7.5 (GLOVE) IMPLANT
GLOVE SKINSENSE NS SZ7.5 (GLOVE) ×2
GLOVE SKINSENSE STRL SZ7.5 (GLOVE) ×1 IMPLANT
GLOVE SS BIOGEL STRL SZ 8 (GLOVE) ×1 IMPLANT
GLOVE SUPERSENSE BIOGEL SZ 8 (GLOVE) ×2
GOWN SRG XL XLNG 56XLVL 4 (GOWN DISPOSABLE) ×1 IMPLANT
GOWN STRL NON-REIN LRG LVL3 (GOWN DISPOSABLE) ×9 IMPLANT
GOWN STRL NON-REIN XL XLG LVL4 (GOWN DISPOSABLE) ×2
KIT BASIN OR (CUSTOM PROCEDURE TRAY) ×3 IMPLANT
KIT ROOM TURNOVER OR (KITS) ×3 IMPLANT
LOOP VESSEL MAXI BLUE (MISCELLANEOUS) IMPLANT
MANIFOLD NEPTUNE II (INSTRUMENTS) IMPLANT
NEEDLE 22X1 1/2 (OR ONLY) (NEEDLE) ×3 IMPLANT
NS IRRIG 1000ML POUR BTL (IV SOLUTION) ×3 IMPLANT
PACK ORTHO EXTREMITY (CUSTOM PROCEDURE TRAY) ×3 IMPLANT
PAD ARMBOARD 7.5X6 YLW CONV (MISCELLANEOUS) ×6 IMPLANT
PAD CAST 4YDX4 CTTN HI CHSV (CAST SUPPLIES) ×2 IMPLANT
PADDING CAST COTTON 4X4 STRL (CAST SUPPLIES) ×4
PEG FULLY THREADED 2.5X22MM (Peg) ×6 IMPLANT
PLATE XLONG 24.4X89.5 RT (Plate) ×3 IMPLANT
PUTTY DBM STAGRAFT PLUS 2CC (Putty) ×3 IMPLANT
SCREW CORT 3.5X16 LNG (Screw) ×15 IMPLANT
SCREW CORT 3.5X18 LNG (Screw) ×3 IMPLANT
SCREW PEG LOCK 2.5X24 (Peg) ×6 IMPLANT
SCREW PEG LOCK 2.5X26 (Peg) ×6 IMPLANT
SCREW PEG LOCK 2.5X28 (Peg) ×3 IMPLANT
SPLINT FIBERGLASS 3X35 (CAST SUPPLIES) ×3 IMPLANT
SPONGE GAUZE 4X4 12PLY (GAUZE/BANDAGES/DRESSINGS) ×3 IMPLANT
SPONGE LAP 4X18 X RAY DECT (DISPOSABLE) IMPLANT
SUT MNCRL AB 4-0 PS2 18 (SUTURE) ×3 IMPLANT
SUT PROLENE 3 0 PS 2 (SUTURE) ×6 IMPLANT
SUT PROLENE 4 0 PS 2 18 (SUTURE) ×9 IMPLANT
SUT VIC AB 3-0 FS2 27 (SUTURE) ×3 IMPLANT
SYR CONTROL 10ML LL (SYRINGE) ×3 IMPLANT
SYSTEM CHEST DRAIN TLS 7FR (DRAIN) IMPLANT
TOWEL OR 17X24 6PK STRL BLUE (TOWEL DISPOSABLE) ×3 IMPLANT
TOWEL OR 17X26 10 PK STRL BLUE (TOWEL DISPOSABLE) ×3 IMPLANT
TUBE CONNECTING 12'X1/4 (SUCTIONS) ×1
TUBE CONNECTING 12X1/4 (SUCTIONS) ×2 IMPLANT
TUBE EVACUATION TLS (MISCELLANEOUS) IMPLANT
UNDERPAD 30X30 INCONTINENT (UNDERPADS AND DIAPERS) ×3 IMPLANT
WATER STERILE IRR 1000ML POUR (IV SOLUTION) ×3 IMPLANT

## 2013-02-13 NOTE — ED Notes (Signed)
MD at bedside with triage RN

## 2013-02-13 NOTE — Op Note (Signed)
See dictation #[id number] Wendall StadeW Elorah Dewing MD

## 2013-02-13 NOTE — ED Notes (Signed)
Playing racket ball,ran into wall with arm out stretched.  inj to rt wrist.  Pain ,deformity and a small puncture wound noted to rt wrist.  Radial pulse present and sensation and movement to hand and fingers good  Ice pack applied

## 2013-02-13 NOTE — Transfer of Care (Signed)
Immediate Anesthesia Transfer of Care Note  Patient: Eric Maynard  Procedure(s) Performed: Procedure(s): OPEN REDUCTION INTERNAL FIXATION (ORIF) DISTAL RADIAL FRACTURE (Right)  Patient Location: PACU  Anesthesia Type:GA combined with regional for post-op pain  Level of Consciousness: awake, sedated and patient cooperative  Airway & Oxygen Therapy: Patient Spontanous Breathing and Patient connected to nasal cannula oxygen  Post-op Assessment: Report given to PACU RN and Post -op Vital signs reviewed and stable  Post vital signs: Reviewed and stable  Complications: No apparent anesthesia complications

## 2013-02-13 NOTE — Anesthesia Procedure Notes (Addendum)
Anesthesia Regional Block:  Supraclavicular block  Pre-Anesthetic Checklist: ,, timeout performed, Correct Patient, Correct Site, Correct Laterality, Correct Procedure, Correct Position, site marked, Risks and benefits discussed,  Surgical consent,  Pre-op evaluation,  At surgeon's request and post-op pain management  Laterality: Right  Prep: chloraprep       Needles:   Needle Type: Echogenic Stimulator Needle     Needle Length: 9cm  Needle Gauge: 21 and 21 G    Additional Needles:  Procedures: ultrasound guided (picture in chart) and nerve stimulator Supraclavicular block  Nerve Stimulator or Paresthesia:  Response: 0.4 mA,   Additional Responses:   Narrative:  Start time: 02/13/2013 7:15 PM End time: 02/13/2013 7:25 PM Injection made incrementally with aspirations every 5 mL.  Performed by: Personally  Anesthesiologist: Arta BruceKevin Ossey MD  Additional Notes: Monitors applied. Patient sedated. Sterile prep and drape,hand hygiene and sterile gloves were used. Relevant anatomy identified.Needle position confirmed.Local anesthetic injected incrementally after negative aspiration. Local anesthetic spread visualized around nerve(s). Vascular puncture avoided. No complications. Image printed for medical record.The patient tolerated the procedure well.        Procedure Name: Intubation Date/Time: 02/13/2013 7:44 PM Performed by: Molli HazardGORDON, Shaylah Mcghie M Pre-anesthesia Checklist: Patient identified, Emergency Drugs available, Suction available and Patient being monitored Patient Re-evaluated:Patient Re-evaluated prior to inductionOxygen Delivery Method: Circle system utilized Preoxygenation: Pre-oxygenation with 100% oxygen Intubation Type: IV induction, Rapid sequence and Cricoid Pressure applied Laryngoscope Size: Miller and 2 Grade View: Grade I Tube type: Oral Tube size: 7.5 mm Number of attempts: 1 Placement Confirmation: ETT inserted through vocal cords under direct vision,   positive ETCO2 and breath sounds checked- equal and bilateral Secured at: 23 cm Tube secured with: Tape Dental Injury: Teeth and Oropharynx as per pre-operative assessment

## 2013-02-13 NOTE — Anesthesia Postprocedure Evaluation (Signed)
Anesthesia Post Note  Patient: Eric NielsenRodolfo Flores Maynard  Procedure(s) Performed: Procedure(s) (LRB): OPEN REDUCTION INTERNAL FIXATION (ORIF) DISTAL RADIAL FRACTURE (Right)  Anesthesia type: general  Patient location: PACU  Post pain: Pain level controlled  Post assessment: Patient's Cardiovascular Status Stable  Last Vitals:  Filed Vitals:   02/13/13 2130  BP: 150/68  Pulse: 79  Temp:   Resp: 12    Post vital signs: Reviewed and stable  Level of consciousness: sedated  Complications: No apparent anesthesia complications

## 2013-02-13 NOTE — Progress Notes (Signed)
Orthopedic Tech Progress Note Patient Details:  Eric NielsenRodolfo Flores Maynard [birth year] [id number]  Ortho Devices Type of Ortho Device: Arm sling Ortho Device/Splint Interventions: Casandra DoffingOrdered   Kamarian Sahakian Craig 02/13/2013, 10:34 PM

## 2013-02-13 NOTE — ED Notes (Signed)
Spoke with charge RN [facility] about pt transfer. PIV secured with kling for transport. Right arm splinted with cardboard splint for transport. Pt reports pain decreased after splint applied. Pt alert and oriented at time of transfer. Pt's daughter driving and verbalizes understanding [facility] and that pt is not to eat or drink anything.

## 2013-02-13 NOTE — H&P (Signed)
Eric Maynard is an 73 y.o. male.   Chief Complaint: Open T 2 forearm fracture right upper extremity HPI: Patient was playing racquetball hit his arm and subsequently developed a open right forearm fracture. The fracture is just above a plate was previously placed for ORIF purposes. He understands risk and benefits and plans for reconstruction. He is here today with his daughter. He notes no numbness or tingling or prior open laceration.  He previously had reconstructive efforts in the past by Dr. Doran Durand.  He denies neck back chest or nominal pain. I've examined him at length.  Past Medical History  Diagnosis Date  . Hypertension   . Kidney stone   . Cataract   . Psoriasis     Past Surgical History  Procedure Laterality Date  . Wrist surgery Right     wrist fracture surgery in 2011  . Eye surgery    . Cholecystectomy N/A 08/02/2012    Procedure: LAPAROSCOPIC CHOLECYSTECTOMY WITH INTRAOPERATIVE CHOLANGIOGRAM;  Surgeon: Gwenyth Ober, MD;  Location: [place];  Service: General;  Laterality: N/A;    History reviewed. No pertinent family history. Social History:  reports that he has never smoked. He has never used smokeless tobacco. He reports that he drinks alcohol. He reports that he does not use illicit drugs.  Allergies: No Known Allergies  Medications Prior to Admission  Medication Sig Dispense Refill  . amLODipine (NORVASC) 5 MG tablet Take 5 mg by mouth daily.      Marland Kitchen etanercept (ENBREL) 25 MG injection Inject 25 mg into the skin 2 (two) times a week.      . folic acid (FOLVITE) 017 MCG tablet Take 400 mcg by mouth daily.      Marland Kitchen docusate sodium (COLACE) 100 MG capsule Take 1 capsule (100 mg total) by mouth every 12 (twelve) hours.  60 capsule  0  . methotrexate (RHEUMATREX) 2.5 MG tablet Take 17.5 mg by mouth once a week. Caution:Chemotherapy. Protect from light.      . ondansetron (ZOFRAN-ODT) 8 MG disintegrating tablet Take 8 mg by mouth every 8 (eight) hours as needed  for nausea.        Results for orders placed during the hospital encounter of 02/13/13 (from the past 48 hour(s))  CBC WITH DIFFERENTIAL     Status: Abnormal   Collection Time    02/13/13  3:33 PM      Result Value Range   WBC 5.9  4.0 - 10.5 K/uL   RBC 3.57 (*) 4.22 - 5.81 MIL/uL   Hemoglobin 12.3 (*) 13.0 - 17.0 g/dL   HCT 36.0 (*) 39.0 - 52.0 %   MCV 100.8 (*) 78.0 - 100.0 fL   MCH 34.5 (*) 26.0 - 34.0 pg   MCHC 34.2  30.0 - 36.0 g/dL   RDW 12.6  11.5 - 15.5 %   Platelets 84 (*) 150 - 400 K/uL   Comment: PLATELET COUNT CONFIRMED BY SMEAR     SPECIMEN CHECKED FOR CLOTS   Neutrophils Relative % 49  43 - 77 %   Neutro Abs 2.9  1.7 - 7.7 K/uL   Lymphocytes Relative 31  12 - 46 %   Lymphs Abs 1.8  0.7 - 4.0 K/uL   Monocytes Relative 16 (*) 3 - 12 %   Monocytes Absolute 0.9  0.1 - 1.0 K/uL   Eosinophils Relative 3  0 - 5 %   Eosinophils Absolute 0.2  0.0 - 0.7 K/uL   Basophils Relative 1  0 - 1 %   Basophils Absolute 0.1  0.0 - 0.1 K/uL  BASIC METABOLIC PANEL     Status: Abnormal   Collection Time    02/13/13  3:33 PM      Result Value Range   Sodium 138  137 - 147 mEq/L   Potassium 3.6 (*) 3.7 - 5.3 mEq/L   Chloride 100  96 - 112 mEq/L   CO2 22  19 - 32 mEq/L   Glucose, Bld 157 (*) 70 - 99 mg/dL   BUN 17  6 - 23 mg/dL   Creatinine, Ser 1.20  0.50 - 1.35 mg/dL   Calcium 9.0  8.4 - 10.5 mg/dL   GFR calc non Af Amer 58 (*) >90 mL/min   GFR calc Af Amer 67 (*) >90 mL/min   Comment: (NOTE)     The eGFR has been calculated using the CKD EPI equation.     This calculation has not been validated in all clinical situations.     eGFR's persistently <90 mL/min signify possible Chronic Kidney     Disease.   Dg Wrist Complete Right  02/13/2013   CLINICAL DATA:  Injury.  Right wrist pain with open wound.  EXAM: RIGHT WRIST - COMPLETE 3+ VIEW  COMPARISON:  None.  FINDINGS: Prior volar plate and screw fixation of the distal radius is identified. There is a transverse fracture of the  radius at the level of the proximal fixation screw. There is mild comminution. The distal radial fragment demonstrates mild ulnar and dorsal angulation with minimal radial displacement.  There is also a comminuted fracture of the distal ulna with a prominent central fragment. There is mild ulnar displacement of the main distal fragment. The fracture extends to the radioulnar articulation. There is prominent overlying soft tissue swelling, predominantly along the volar forearm.  IMPRESSION: Comminuted fractures of the distal radius and ulna.   Electronically Signed   By: Logan Bores   On: 02/13/2013 16:56    Review of Systems  Constitutional: Negative.   HENT: Negative.   Eyes: Negative.   Respiratory: Negative.   Cardiovascular: Negative.   Gastrointestinal: Negative.   Genitourinary: Negative.   Skin:       Open laceration right forearm  Neurological: Negative.   Endo/Heme/Allergies:       History of low platelets  Psychiatric/Behavioral: Negative.     Blood pressure 168/80, pulse 71, temperature 97.9 F (36.6 C), temperature source Oral, resp. rate 18, height 6' (1.829 m), weight 75.297 kg (166 lb), SpO2 99.00%. Physical Exam open right forearm fracture with gross displacement and 2 cm laceration to the forearm. He has an intact pulse. He has intact refill. He has marked swelling. There is no obvious compartment syndrome but the swelling is impressive. There is no evidence of elbow or shoulder trauma.  Lower extremity examination is benign his left upper extremity is benign in terms of any major traumatic insult.  Marland Kitchen.The patient is alert and oriented in no acute distress the patient complains of pain in the affected upper extremity.  The patient is noted to have a normal HEENT exam.  Lung fields show equal chest expansion and no shortness of breath  abdomen exam is nontender without distention.  Lower extremity examination does not show any fracture dislocation or blood clot  symptoms.  Pelvis is stable neck and back are stable and nontender  Assessment/Plan Will plan to proceed with irrigation and debridement followed by removal of his old hardware followed by re\re  fixation with an extended plate about the forearm right radius.  Marland Kitchen.We are planning surgery for your upper extremity. The risk and benefits of surgery include risk of bleeding infection anesthesia damage to normal structures and failure of the surgery to accomplish its intended goals of relieving symptoms and restoring function with this in mind we'll going to proceed. I have specifically discussed with the patient the pre-and postoperative regime and the does and don'ts and risk and benefits in great detail. Risk and benefits of surgery also include risk of dystrophy chronic nerve pain failure of the healing process to go onto completion and other inherent risks of surgery The relavent the pathophysiology of the disease/injury process, as well as the alternatives for treatment and postoperative course of action has been discussed in great detail with the patient who desires to proceed.  We will do everything in our power to help you (the patient) restore function to the upper extremity. Is a pleasure to see this patient today.  Paulene Floor 02/13/2013, 6:54 PM

## 2013-02-13 NOTE — Anesthesia Preprocedure Evaluation (Signed)
Anesthesia Evaluation  Patient identified by MRN, date of birth, ID band Patient awake    Reviewed: Allergy & Precautions, H&P , NPO status , Patient's Chart, lab work & pertinent test results  Airway Mallampati: I TM Distance: >3 FB Neck ROM: Full    Dental   Pulmonary          Cardiovascular hypertension, Pt. on medications     Neuro/Psych    GI/Hepatic   Endo/Other    Renal/GU      Musculoskeletal   Abdominal   Peds  Hematology   Anesthesia Other Findings   Reproductive/Obstetrics                           Anesthesia Physical Anesthesia Plan  ASA: II and emergent  Anesthesia Plan: General   Post-op Pain Management:    Induction: Intravenous  Airway Management Planned: Oral ETT  Additional Equipment:   Intra-op Plan:   Post-operative Plan: Extubation in OR  Informed Consent: I have reviewed the patients History and Physical, chart, labs and discussed the procedure including the risks, benefits and alternatives for the proposed anesthesia with the patient or authorized representative who has indicated his/her understanding and acceptance.     Plan Discussed with: CRNA and Surgeon  Anesthesia Plan Comments:         Anesthesia Quick Evaluation

## 2013-02-13 NOTE — ED Provider Notes (Signed)
CSN: [id number]     Arrival date & time 02/13/13  1513 History   First MD Initiated Contact with Patient 02/13/13 1526     No chief complaint on file.  (Consider location/radiation/quality/duration/timing/severity/associated sxs/prior Treatment) HPI Comments: Patient is a 73 year old male presents with a right wrist injury. He was playing racquetball and inadvertently hit the wall while trying to hit the ball. He is complaining of severe pain and deformity to the right wrist. He has a history of prior repair of the same wrist after slip and fall on ice approximately 3 years ago. He tells me he has a plate and screws in the wrist.  Patient is a 73 y.o. male presenting with arm injury. The history is provided by the patient.  Arm Injury Location:  Wrist Time since incident:  1 hour Injury: yes   Mechanism of injury comment:  Racquetball injury Wrist location:  R wrist Pain details:    Quality:  Sharp   Radiates to:  Does not radiate   Severity:  Severe   Onset quality:  Sudden   Timing:  Constant   Progression:  Unchanged Chronicity:  New Handedness:  Right-handed   Past Medical History  Diagnosis Date  . Hypertension   . Kidney stone   . Cataract    Past Surgical History  Procedure Laterality Date  . Wrist surgery    . Eye surgery    . Cholecystectomy N/A 08/02/2012    Procedure: LAPAROSCOPIC CHOLECYSTECTOMY WITH INTRAOPERATIVE CHOLANGIOGRAM;  Surgeon: Cherylynn Ridges, MD;  Location: [facility];  Service: General;  Laterality: N/A;   No family history on file. History  Substance Use Topics  . Smoking status: Never Smoker   . Smokeless tobacco: Never Used  . Alcohol Use: Yes     Comment: occasional    Review of Systems  All other systems reviewed and are negative.    Allergies  Review of patient's allergies indicates no known allergies.  Home Medications   Current Outpatient Rx  Name  Route  Sig  Dispense  Refill  . amLODipine (NORVASC) 5 MG tablet   Oral   Take 5  mg by mouth daily.         Marland Kitchen docusate sodium (COLACE) 100 MG capsule   Oral   Take 1 capsule (100 mg total) by mouth every 12 (twelve) hours.   60 capsule   0   . folic acid (FOLVITE) 400 MCG tablet   Oral   Take 400 mcg by mouth daily.         . methotrexate (RHEUMATREX) 2.5 MG tablet   Oral   Take 17.5 mg by mouth once a week. Caution:Chemotherapy. Protect from light.         . ondansetron (ZOFRAN-ODT) 8 MG disintegrating tablet   Oral   Take 8 mg by mouth every 8 (eight) hours as needed for nausea.          BP 124/58  Pulse 66  Temp(Src) 98.7 F (37.1 C) (Oral)  Ht 6' (1.829 m)  Wt 166 lb (75.297 kg)  BMI 22.51 kg/m2 Physical Exam  Nursing note and vitals reviewed. Constitutional: He is oriented to person, place, and time. He appears well-developed and well-nourished. No distress.  HENT:  Head: Normocephalic.  Neck: Normal range of motion. Neck supple.  Cardiovascular: Normal rate, regular rhythm and normal heart sounds.   Pulmonary/Chest: Effort normal and breath sounds normal.  Abdominal: Soft. Bowel sounds are normal.  Musculoskeletal: Normal range of motion.  He exhibits no edema.  The right forearm is noted to have an obvious deformity just proximal to the wrist. There is a small puncture wound present at the angle of the deformity.  Neurological: He is alert and oriented to person, place, and time.  Skin: Skin is warm and dry. He is not diaphoretic.    ED Course  Procedures (including critical care time) Labs Review Labs Reviewed  CBC WITH DIFFERENTIAL  BASIC METABOLIC PANEL   Imaging Review No results found.    MDM  No diagnosis found. X-rays reveal comminuted fractures of the distal radius and ulna just proximal to the hardware from the prior repair. There is a small abrasion where the angulation which I am concerned may represent an open fracture. He was given IV Ancef. I have spoken with Dr. Amanda PeaGramig from hand surgery who would like the  patient to be transferred [facility] where he can evaluate him. He will be splinted and sent by private auto. Dr. Denton LankSteinl also made aware.    Geoffery Lyonsouglas Tiahna Cure, MD 02/13/13 [phone]

## 2013-02-14 ENCOUNTER — Encounter (HOSPITAL_COMMUNITY): Payer: Self-pay | Admitting: Orthopedic Surgery

## 2013-02-14 NOTE — Evaluation (Signed)
Occupational Therapy Evaluation and Discharge Patient Details Name: Eric NielsenRodolfo Flores Maynard MRN: [id number] DOB: Jun 25, 1940 Today's Date: 02/14/2013 Time: [id number] OT Time Calculation (min): 19 min  OT Assessment / Plan / Recommendation History of present illness OPEN REDUCTION INTERNAL FIXATION (ORIF) DISTAL RADIAL FRACTURE (Right   Clinical Impression   This 73 yo male admitted and underwent above presents to acute OT with all education completed, follow up per MD. Will D/C from acute OT.    OT Assessment   (rehab per MD)    Follow Up Recommendations  No OT follow up       Equipment Recommendations  None recommended by OT          Precautions / Restrictions Precautions Required Braces or Orthoses: Sling Restrictions Weight Bearing Restrictions: Yes RUE Weight Bearing: Non weight bearing   Pertinent Vitals/Pain 8/10 in arm; RN made aware    ADL  Transfers/Ambulation Related to ADLs: S for all without AD ADL Comments: Set-up/S to mod A due to RUE deficits. Educated to pt and wife to keep arm clean and dry with trying to bath (double bag and hold arm up while showering); wear elastic waist pants, slip on shoes, stretching armed shirts (putting RUE in shirt first and taking out last)     Acute Rehab OT Goals Patient Stated Goal: Home today  Visit Information  Last OT Received On: 02/14/13 Assistance Needed: +1 History of Present Illness: OPEN REDUCTION INTERNAL FIXATION (ORIF) DISTAL RADIAL FRACTURE (Right       Prior Functioning     Home Living Family/patient expects to be discharged to:: Private residence Living Arrangements: Spouse/significant other Available Help at Discharge: Family;Available 24 hours/day Type of Home: House Home Equipment: None Prior Function Level of Independence: Independent Communication Communication: No difficulties Dominant Hand: Right         Vision/Perception Vision - History Patient Visual Report: No change from  baseline   Cognition  Cognition Arousal/Alertness: Awake/alert Behavior During Therapy: WFL for tasks assessed/performed Overall Cognitive Status: Within Functional Limits for tasks assessed    Extremity/Trunk Assessment Upper Extremity Assessment Upper Extremity Assessment: RUE deficits/detail RUE Deficits / Details: S/p surgery to foream RUE Coordination: decreased fine motor;decreased gross motor     Mobility Bed Mobility Overal bed mobility: Independent Transfers Overall transfer level: Independent     Exercise Other Exercises Other Exercises: composite finger flexion/extension (10 times every waking hour); shouder flexion/extension and shoulder horizontal abuduction/adduction (10 reps 5x/day); propping hand so it is higher than elbow for edema control      End of Session OT - End of Session Activity Tolerance: Patient tolerated treatment well Patient left:  (sitting EOB with wife setting up his lunch) Nurse Communication: Patient requests pain meds  GO Functional Assessment Tool Used: Clincal observation Functional Limitation: Self care Self Care Current Status (Y7829(G8987): At least 1 percent but less than 20 percent impaired, limited or restricted Self Care Goal Status (F6213(G8988): At least 1 percent but less than 20 percent impaired, limited or restricted Self Care Discharge Status [phone](G8989): At least 1 percent but less than 20 percent impaired, limited or restricted   Evette GeorgesLeonard, Frutoso Dimare Eva [phone] 02/14/2013, 1:07 PM

## 2013-02-14 NOTE — Progress Notes (Signed)
UR completed 

## 2013-02-14 NOTE — Progress Notes (Signed)
Subjective: 1 Day Post-Op   Patient reports pain as moderate.   He is with family He denies neck back chest or abdominal pain. He is voiding and he is tolerating a regular diet  Objective: Vital signs in last 24 hours: Temp:  [97.3 F (36.3 C)-98.1 F (36.7 C)] 98.1 F (36.7 C) (01/16 1453) Pulse Rate:  [79-84] 82 (01/16 1453) Resp:  [12-16] 16 (01/16 1453) BP: (144-184)/(60-84) 163/66 mmHg (01/16 1453) SpO2:  [93 %-98 %] 93 % (01/16 1453)  Intake/Output from previous day: 01/15 0701 - 01/16 0700 In: 2337.5 [I.V.:2237.5; IV Piggyback:100] Out: 1500 [Urine:1500] Intake/Output this shift:     Recent Labs  02/13/13 1533  HGB 12.3*    Recent Labs  02/13/13 1533  WBC 5.9  RBC 3.57*  HCT 36.0*  PLT 84*    Recent Labs  02/13/13 1533  NA 138  K 3.6*  CL 100  CO2 22  BUN 17  CREATININE 1.20  GLUCOSE 157*  CALCIUM 9.0   No results found for this basename: LABPT, INR,  in the last 72 hours The patient's right arm is examined. His dressing is clean dry and intact. He is neurovascularly intact. He is overall quite stable. There's no signs of DVT infection or other problems. Overall he looks stable Neurologically intact ABD soft Sensation intact distally Intact pulses distally No cellulitis present Compartment soft  Assessment/Plan: 1 Day Post-Op   Advance diet Plan for discharge tomorrow I discussed all issues with his family. We have discussed the necessity of elevation massage and range of motion to the fingers. We have specifically discussed the necessity of proper care and adherence to details postoperatively. The patient and family are aware of all plans and issues.  Karen ChafeGRAMIG III,Abisola Carrero M 02/14/2013, 7:07 PM

## 2013-02-14 NOTE — Op Note (Signed)
NAME:  Ander SladeFLORES GUTIERREZ, Fredric    ACCOUNT NO.:  [id number]  MEDICAL RECORD NO.:  [id number]  LOCATION:  [id number]                        FACILITY:  [facility]  PHYSICIAN:  Dionne AnoWilliam M. Patricie Geeslin, M.D.DATE OF BIRTH:  1940/09/08  DATE OF PROCEDURE: DATE OF DISCHARGE:                              OPERATIVE REPORT   PREOPERATIVE DIAGNOSES:  Right both-bone forearm fracture, with a history of ORIF of the radius secondary to fracture and fracture site involvement about the radius plate proximal aspect, type 1-2, open in nature.  POSTOPERATIVE DIAGNOSES:  Right both-bone forearm fracture, with a history of ORIF of the radius secondary to fracture and fracture site involvement about the radius plate proximal aspect, type 1-2, open in nature.  PROCEDURE: 1. Irrigation and debridement, type 1 open fracture, right forearm. 2. Flexor carpi radialis extensive tenolysis, tenosynovectomy, right     forearm. 3. Deep hardware removal in the form of a DVR plate and screw     construct, right radius. 4. Open reduction and internal fixation, right radius shaft fracture     with an extended 7-hole DVR plate. 5. Stress radiography. 6. Close treatment ulna fracture distally with manipulation.  SURGEON:  Dionne AnoWilliam M. Amanda PeaGramig, M.D.  ASSISTANT:  None.  COMPLICATION:  None.  ANESTHESIA:  General.  TOURNIQUET TIME:  Less than an hour.  DRAINS:  None.  INDICATIONS:  A 73 year old male injured while playing racquetball today, his arm hit against a wall.  He complains of pain and obvious deformity.  Unfortunately, he fractured at the proximal end of the plate.  This will necessitate plate removal and revision fixation.  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  I have counseled him and his family in regard to all issues and plans and they understand.  He has a small abrasion versus type 1 open puncture wound.  We are going to treat this  presumptively as a type 1 open puncture.  It is not overly impressive on exam.  OPERATION IN DETAIL:  The patient was seen by myself and Anesthesia, taken to the operative suite, underwent smooth induction of general anesthesia, laid supine, prepped and draped in usual sterile fashion. Betadine scrub and paint about the right upper extremity.  Body parts well padded.  Time-out was called.  Pre and postop check was complete, and 2 g of Ancef were given preoperatively.  I should note Dr. Michelle Piperssey provided a block anesthetic preoperatively.  The operation commenced with curvilinear volar radial incision. Dissection was carried out and immediately I had to perform a very extensive FCR tenolysis, tenosynovectomy, this was extensive in nature, given his old scar tissue and prior ORIF.  Once tenolysis was accomplished, I went down to the fracture site.  I was not overly impressed with the type 1 open puncture, but nevertheless treated this presumptively as an open fracture with irrigation and debridement.  This was a debridement of skin, subcutaneous tissue, bone, and associated soft tissue.  Copious amounts of saline (3 L) were placed to the area.  Following this, I then entered the interval of Sherilyn CooterHenry, I dissected the radial artery out and then retracted it radially.  I retracted the FCR tendon with blunt-tipped  retractors.  Following this, I accessed the radius proximally and took down communication vessels between the radial artery and the soft tissues involved.  I exposed the plate, the plate was fractured to the most proximal drill hole.  This was a 4-hole plate with the last hole left open.  At this time, I very carefully dissected distally and elevated the pronator off the plate.  I then accessed the plate and removed it in standard AO technique.  This was a 4-hole DVR plate.  Following this, I then treated the bone distally and following this, curettaged the fracture site, irrigated  the fracture site, and I did place allograft bone graft in this vicinity to help improve bone healing characteristics.  This with a Biomet allograft bone graft.  Following this, I then placed a 7-hole extended DVR plate to my satisfaction without difficulty.  Distally, I went ahead and exchanged the smooth pegs for fully threaded screws to achieve excellent purchase and this had a nice tight snug fit and I checked all lengths.  Proximally, the patient had 8 cortices of fixation via four 3.5 mm screws.  Achieved 8 cortices proximally and the patient had multiple cortices distally for fixation.  The fracture was reduced and fixated in a compression mode at the plate.  There were no complicating features.  I irrigated copiously, closed the pronator, and then closed the skin edge with Prolene after hemostasis was secured.  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  His compartments were soft and I did make it a point to split his compartments open to prevent excessive pressure phenomenon.  I x-rayed him extensively.  Plate and screw fixation looked excellent. I was pleased about these findings.  His ulna fracture distally was very comminuted and I did not feel that fixation would benefit him.  I felt this would only serve to destabilize this in a more advanced fashion and thus felt that closed healing would be in his best interest.  Following this, I placed him in a sugar-tong splint.  Prior to this, Adaptic, Xeroform, and associated soft tissue dressing was applied.  He tolerated the procedure well.  There were no complicating features.  All sponge, needle, and instruments counts reported as correct.  It was an absolute pleasure to see him today and participate in his care.  We will participate in his postop recovery.  We will admit him and he will return to the office in 12-14 days.  I want to make sure  we wait a complete 14 days to take his stitches out.     Dionne Ano. Amanda Pea, M.D.     [facility]  D:  02/13/2013  T:  02/14/2013  Job:  [id number]

## 2013-02-15 MED ORDER — OXYCODONE HCL 5 MG PO TABS: 5.0000 mg | ORAL_TABLET | ORAL | Status: DC | PRN

## 2013-02-15 MED ORDER — ASCORBIC ACID 1000 MG PO TABS: 1000.0000 mg | ORAL_TABLET | Freq: Every day | ORAL | Status: DC

## 2013-02-15 NOTE — Discharge Summary (Signed)
Physician Discharge Summary  Patient ID: Eric NielsenRodolfo Flores Maynard MRN: [id number] DOB/AGE: November 17, 1940 73 y.o.  Admit date: 02/13/2013 Discharge date: 02/15/2013  Admission Diagnoses: Open fracture right forearm  Discharge Diagnoses: Same Active Problems:   Fracture of radius, distal, right, open   Discharged Condition: good  Hospital Course: Patient was admitted postoperatively after removal of old forearm hardware and placement of new forearm hardware do to a open fracture about his forearm right upper extremity. He did quite well there no problems features during his postoperative course. At time of discharge he was at E. drink move and perform range of motion to the fingers with normal sensation and motor function. He noted no problems. He was set for discharge and anxious to return to home environment. Consults: None  Significant Diagnostic Studies: labs: See  Treatments: surgery: See op note  Discharge Exam: Blood pressure 172/75, pulse 75, temperature 98.5 F (36.9 C), temperature source Oral, resp. rate 16, height 6' (1.829 m), weight 75.297 kg (166 lb), SpO2 93.00%. General appearance: alert, cooperative and appears stated age Head: Normocephalic, without obvious abnormality, atraumatic patient has good range of motion to the fingers. Patient has normal sensation to the right hand and excellent flexion and extension given his injury. His cast/bandages clean dry intact there's no signs of instability or infection. His lower stem examination is benign there's no signs of DVT or other abnormality. He is doing quite well and was ready for discharge.Marland Kitchen.Marland Kitchen.The patient is alert and oriented in no acute distress the patient complains of pain in the affected upper extremity.  The patient is noted to have a normal HEENT exam.  Lung fields show equal chest expansion and no shortness of breath  abdomen exam is nontender without distention.  Lower extremity examination does not show any fracture  dislocation or blood clot symptoms.  Pelvis is stable neck and back are stable and nontender  Disposition: 01-Home or Self Care     Medication List    STOP taking these medications       ENBREL 25 MG injection  Generic drug:  etanercept      TAKE these medications       amLODipine 5 MG tablet  Commonly known as:  NORVASC  Take 5 mg by mouth daily.     ascorbic acid 1000 MG tablet  Commonly known as:  VITAMIN C  Take 1 tablet (1,000 mg total) by mouth daily.     docusate sodium 100 MG capsule  Commonly known as:  COLACE  Take 1 capsule (100 mg total) by mouth every 12 (twelve) hours.     folic acid 400 MCG tablet  Commonly known as:  FOLVITE  Take 400 mcg by mouth daily.     oxyCODONE 5 MG immediate release tablet  Commonly known as:  Oxy IR/ROXICODONE  Take 1-2 tablets (5-10 mg total) by mouth every 4 (four) hours as needed for moderate pain.           Follow-up Information   Follow up with Karen ChafeGRAMIG III,Tawn Fitzner M, MD.   Specialty:  Orthopedic Surgery   Contact information:   [address] [place] [address] [phone]       Signed: Karen ChafeGRAMIG III,Krishna Heuer M 02/15/2013, 9:24 AM

## 2013-02-15 NOTE — Discharge Instructions (Signed)

## 2014-07-20 IMAGING — CR DG WRIST COMPLETE 3+V*R*
4 series · 4 of 4 positions shown · non-contrast
Comparison: None.

CLINICAL DATA: Injury.  Right wrist pain with open wound.

EXAM:
RIGHT WRIST - COMPLETE 3+ VIEW

[x wrist pa right]
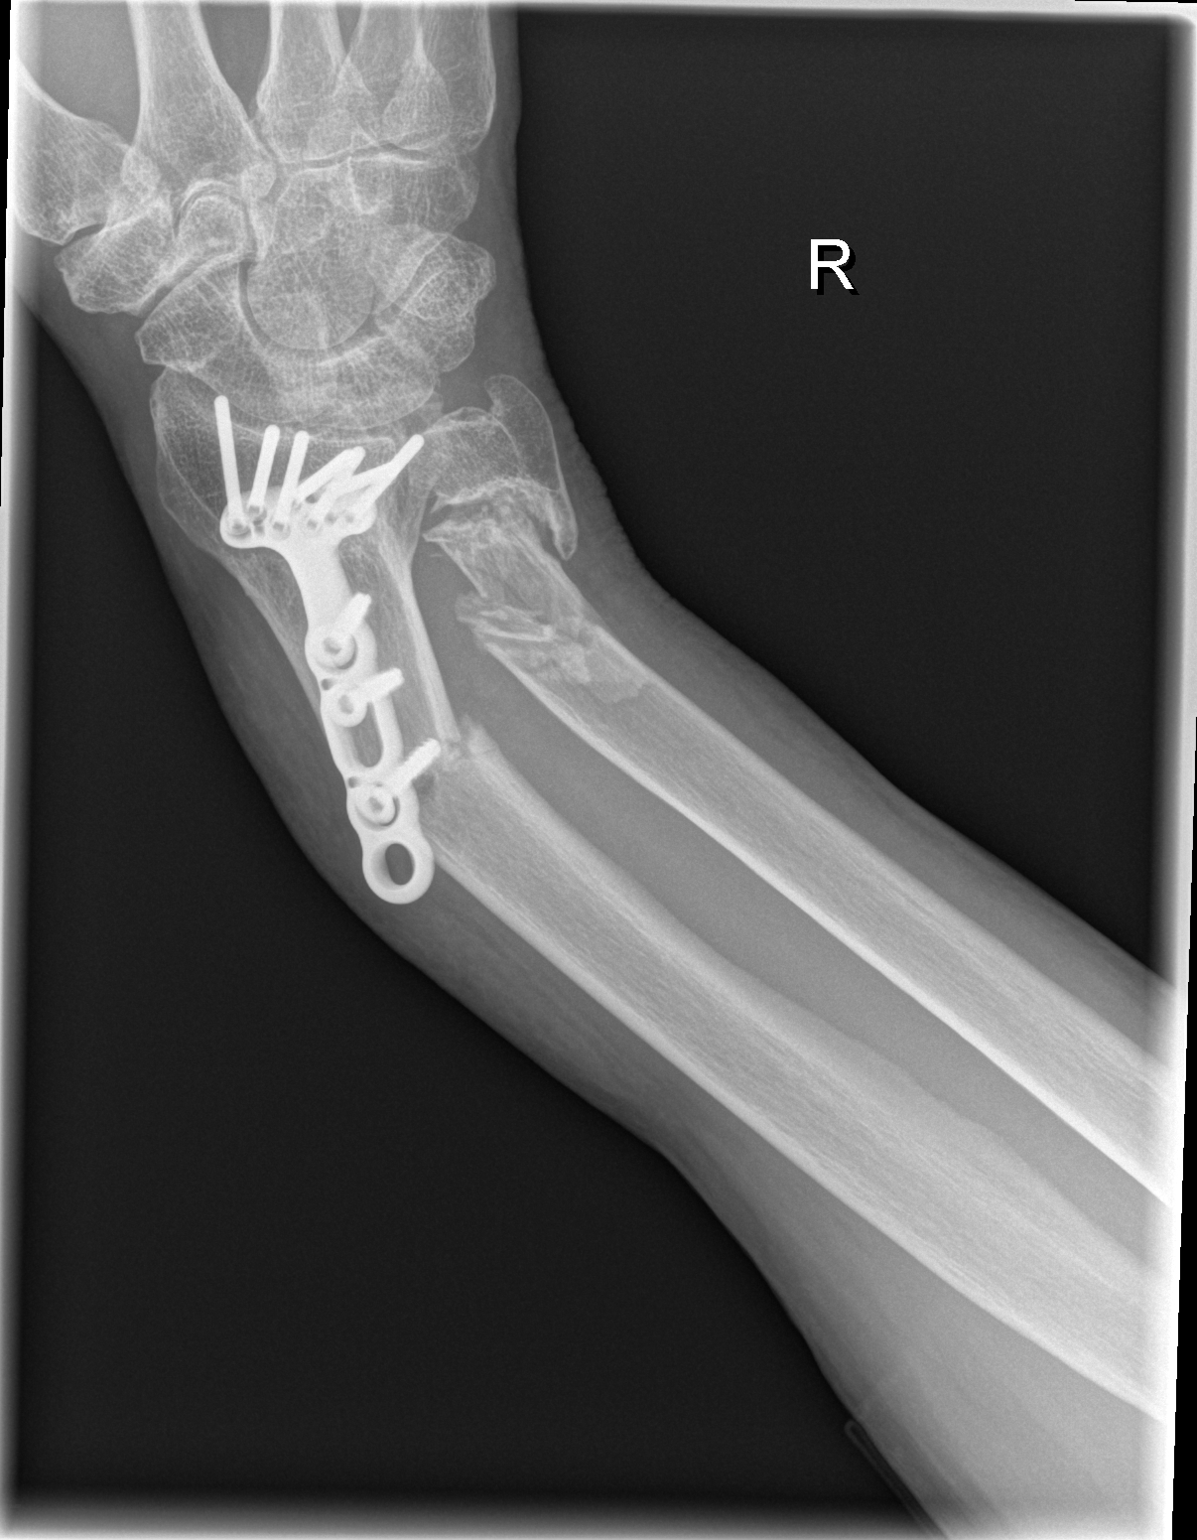

[x wrist obl right]
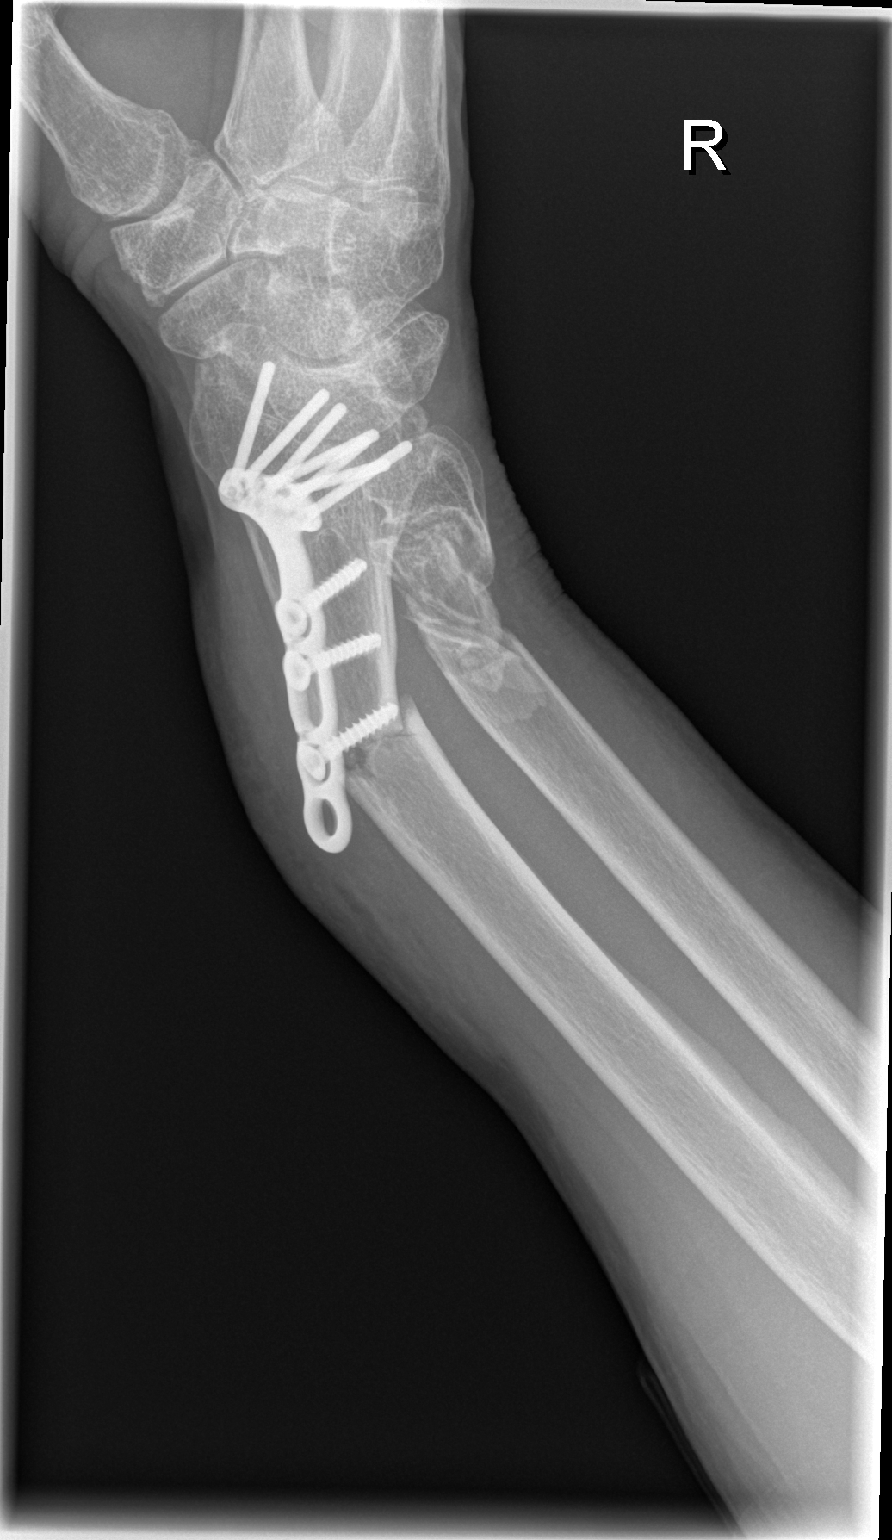

[x wrist lat right]
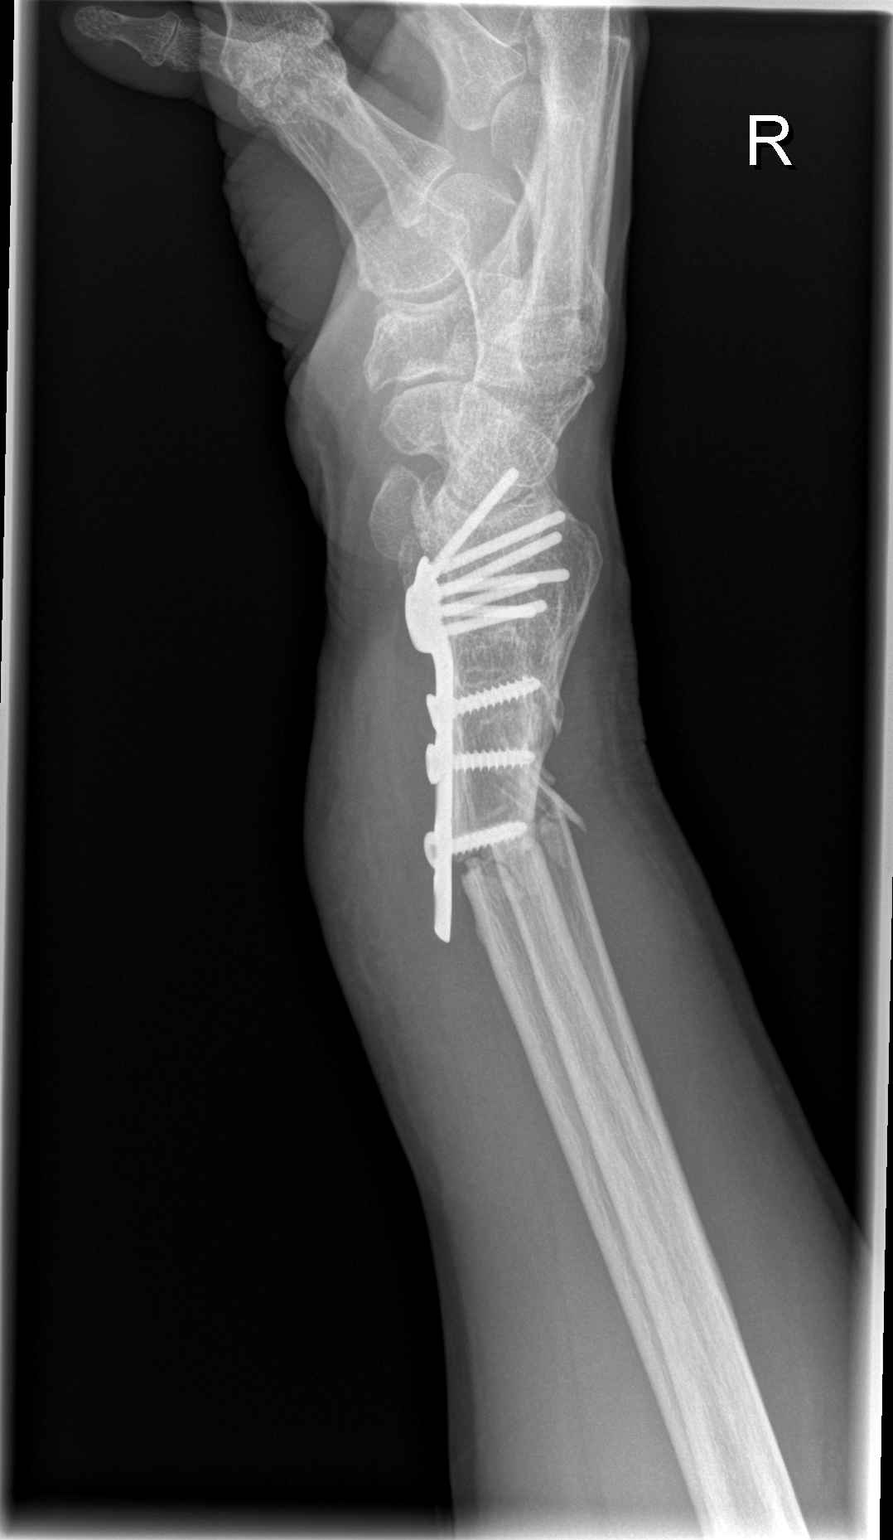

[x navicular]
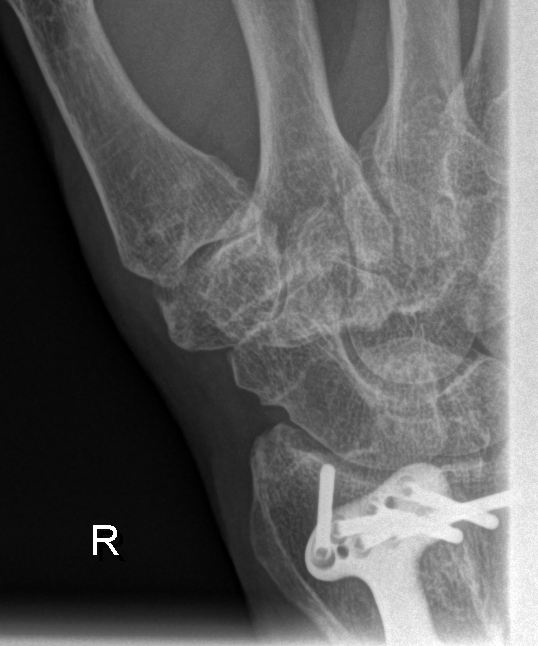

[4 of 4 positions shown; findings below may reference images not displayed]

FINDINGS: Prior volar plate and screw fixation of the distal radius is
identified. There is a transverse fracture of the radius at the
level of the proximal fixation screw. There is mild comminution. The
distal radial fragment demonstrates mild ulnar and dorsal angulation
with minimal radial displacement.

There is also a comminuted fracture of the distal ulna with a
prominent central fragment. There is mild ulnar displacement of the
main distal fragment. The fracture extends to the radioulnar
articulation. There is prominent overlying soft tissue swelling,
predominantly along the volar forearm.
IMPRESSION: Comminuted fractures of the distal radius and ulna.

## 2019-03-16 ENCOUNTER — Ambulatory Visit: Payer: Medicare Other

## 2021-07-29 ENCOUNTER — Encounter (HOSPITAL_BASED_OUTPATIENT_CLINIC_OR_DEPARTMENT_OTHER): Payer: Self-pay

## 2021-07-29 ENCOUNTER — Telehealth (HOSPITAL_BASED_OUTPATIENT_CLINIC_OR_DEPARTMENT_OTHER): Payer: Self-pay | Admitting: Emergency Medicine

## 2021-07-29 ENCOUNTER — Other Ambulatory Visit: Payer: Self-pay

## 2021-07-29 ENCOUNTER — Other Ambulatory Visit (HOSPITAL_BASED_OUTPATIENT_CLINIC_OR_DEPARTMENT_OTHER): Payer: Self-pay

## 2021-07-29 ENCOUNTER — Emergency Department (HOSPITAL_BASED_OUTPATIENT_CLINIC_OR_DEPARTMENT_OTHER)
Admission: EM | Admit: 2021-07-29 | Discharge: 2021-07-29 | Disposition: A | Discharge status: Home / Self Care | Payer: Medicare HMO | Attending: Emergency Medicine | Admitting: Emergency Medicine

## 2021-07-29 ENCOUNTER — Emergency Department (HOSPITAL_BASED_OUTPATIENT_CLINIC_OR_DEPARTMENT_OTHER): Payer: Medicare HMO

## 2021-07-29 DIAGNOSIS — N132 Hydronephrosis with renal and ureteral calculous obstruction: Secondary | ICD-10-CM | POA: Insufficient documentation

## 2021-07-29 DIAGNOSIS — N200 Calculus of kidney: Secondary | ICD-10-CM

## 2021-07-29 DIAGNOSIS — E871 Hypo-osmolality and hyponatremia: Secondary | ICD-10-CM | POA: Insufficient documentation

## 2021-07-29 DIAGNOSIS — N289 Disorder of kidney and ureter, unspecified: Secondary | ICD-10-CM

## 2021-07-29 DIAGNOSIS — R1030 Lower abdominal pain, unspecified: Secondary | ICD-10-CM | POA: Diagnosis present

## 2021-07-29 LAB — COMPREHENSIVE METABOLIC PANEL
ALT: 5 U/L (ref 0–44)
AST: 16 U/L (ref 15–41)
Albumin: 4.2 g/dL (ref 3.5–5.0)
Alkaline Phosphatase: 89 U/L (ref 38–126)
Anion gap: 9 (ref 5–15)
BUN: 25 mg/dL — ABNORMAL HIGH (ref 8–23)
CO2: 21 mmol/L — ABNORMAL LOW (ref 22–32)
Calcium: 9.1 mg/dL (ref 8.9–10.3)
Chloride: 101 mmol/L (ref 98–111)
Creatinine, Ser: 1.86 mg/dL — ABNORMAL HIGH (ref 0.61–1.24)
GFR, Estimated: 36 mL/min — ABNORMAL LOW (ref >60)
Glucose, Bld: 202 mg/dL — ABNORMAL HIGH (ref 70–99)
Potassium: 3.4 mmol/L — ABNORMAL LOW (ref 3.5–5.1)
Sodium: 131 mmol/L — ABNORMAL LOW (ref 135–145)
Total Bilirubin: 1.1 mg/dL (ref 0.3–1.2)
Total Protein: 7.9 g/dL (ref 6.5–8.1)

## 2021-07-29 LAB — URINALYSIS, MICROSCOPIC (REFLEX): RBC / HPF: >50 RBC/hpf (ref 0–5)

## 2021-07-29 LAB — CBC WITH DIFFERENTIAL/PLATELET
Abs Immature Granulocytes: 0.05 10*3/uL (ref 0.00–0.07)
Basophils Absolute: 0.1 10*3/uL (ref 0.0–0.1)
Basophils Relative: 1 %
Eosinophils Absolute: 0.2 10*3/uL (ref 0.0–0.5)
Eosinophils Relative: 2 %
HCT: 37.4 % — ABNORMAL LOW (ref 39.0–52.0)
Hemoglobin: 13 g/dL (ref 13.0–17.0)
Immature Granulocytes: 1 %
Lymphocytes Relative: 11 %
Lymphs Abs: 1.2 10*3/uL (ref 0.7–4.0)
MCH: 33.2 pg (ref 26.0–34.0)
MCHC: 34.8 g/dL (ref 30.0–36.0)
MCV: 95.4 fL (ref 80.0–100.0)
Monocytes Absolute: 0.9 10*3/uL (ref 0.1–1.0)
Monocytes Relative: 8 %
Neutro Abs: 8.1 10*3/uL — ABNORMAL HIGH (ref 1.7–7.7)
Neutrophils Relative %: 77 %
Platelets: 174 10*3/uL (ref 150–400)
RBC: 3.92 MIL/uL — ABNORMAL LOW (ref 4.22–5.81)
RDW: 12.2 % (ref 11.5–15.5)
WBC: 10.5 10*3/uL (ref 4.0–10.5)
nRBC: 0 % (ref 0.0–0.2)

## 2021-07-29 LAB — URINALYSIS, ROUTINE W REFLEX MICROSCOPIC
Bilirubin Urine: NEGATIVE
Glucose, UA: 250 mg/dL — AB
Ketones, ur: NEGATIVE mg/dL
Leukocytes,Ua: NEGATIVE
Nitrite: NEGATIVE
Protein, ur: 100 mg/dL — AB
Specific Gravity, Urine: 1.02 (ref 1.005–1.030)
pH: 6 (ref 5.0–8.0)

## 2021-07-29 MED ORDER — KETOROLAC TROMETHAMINE 30 MG/ML IJ SOLN
15.0000 mg | Freq: Once | INTRAMUSCULAR | Status: AC
  Administered 2021-07-29: 15 mg via INTRAVENOUS
  Filled 2021-07-29: qty 1

## 2021-07-29 MED ORDER — ONDANSETRON 8 MG PO TBDP: ORAL_TABLET | ORAL | 0 refills | Status: AC

## 2021-07-29 MED ORDER — TAMSULOSIN HCL 0.4 MG PO CAPS
0.4000 mg | ORAL_CAPSULE | ORAL | Status: AC
  Administered 2021-07-29: 0.4 mg via ORAL
  Filled 2021-07-29: qty 1

## 2021-07-29 MED ORDER — TRAMADOL HCL 50 MG PO TABS
50.0000 mg | ORAL_TABLET | Freq: Once | ORAL | Status: AC
  Administered 2021-07-29: 50 mg via ORAL
  Filled 2021-07-29: qty 1

## 2021-07-29 MED ORDER — LIDOCAINE 5 % EX PTCH
2.0000 | MEDICATED_PATCH | CUTANEOUS | Status: DC
Start: 2021-07-29 — End: 2021-07-29
  Administered 2021-07-29: 2 via TRANSDERMAL
  Filled 2021-07-29 (×2): qty 2

## 2021-07-29 MED ORDER — SODIUM CHLORIDE 0.9 % IV BOLUS
500.0000 mL | Freq: Once | INTRAVENOUS | Status: AC
  Administered 2021-07-29: 500 mL via INTRAVENOUS

## 2021-07-29 MED ORDER — TAMSULOSIN HCL 0.4 MG PO CAPS: 0.4000 mg | ORAL_CAPSULE | Freq: Every day | ORAL | 0 refills | Status: AC

## 2021-07-29 MED ORDER — TRAMADOL HCL 50 MG PO TABS: 50.0000 mg | ORAL_TABLET | Freq: Two times a day (BID) | ORAL | 0 refills | Status: DC | PRN

## 2021-07-29 MED ORDER — TRAMADOL HCL 50 MG PO TABS
50.0000 mg | ORAL_TABLET | Freq: Two times a day (BID) | ORAL | 0 refills | Status: AC | PRN
  Filled 2021-07-29: qty 9, 5d supply, fill #0

## 2021-07-29 NOTE — Telephone Encounter (Signed)
[facility] pharmacy called to notify staff that patient's tramadol prescription from last night cannot be filled as the pharmacy does not have it. Patient requested prescription be changed to [facility] outpatient pharmacy. Will attempt to cancel previous prescription and rewrite.   Shawntel Farnworth, PA-C [facility], [email or web address]

## 2021-07-29 NOTE — ED Triage Notes (Signed)
L sided flank pain x1 hour.

## 2021-07-29 NOTE — ED Provider Notes (Signed)
[facility] Provider Note   CSN: [id number] Arrival date & time: 07/29/21  0157     History  Chief Complaint  Patient presents with   Flank Pain    Eric Maynard is a 81 y.o. male.  The history is provided by the patient.  Flank Pain This is a new problem. The current episode started less than 1 hour ago. The problem occurs constantly. The problem has not changed since onset.Pertinent negatives include no chest pain, no abdominal pain, no headaches and no shortness of breath. Nothing aggravates the symptoms. Nothing relieves the symptoms. He has tried nothing for the symptoms. The treatment provided no relief.       Home Medications Prior to Admission medications   Medication Sig Start Date End Date Taking? Authorizing Provider  Blood Glucose Monitoring Suppl (FIFTY50 GLUCOSE METER 2.0) w/Device KIT Use OneTouch Ultra meter to check sugars daily. 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 Use 1 OneTouch Delica lancet to check sugars. Dx: E11.29 07/15/19  Yes [provider]  tamsulosin (FLOMAX) 0.4 MG CAPS capsule Take 1 capsule (0.4 mg total) by mouth daily. 07/29/21  Yes Niklaus Mamaril, MD  traMADol (ULTRAM) 50 MG tablet Take 1 tablet (50 mg total) by mouth every 12 (twelve) hours as needed for severe pain. 07/29/21  Yes Mairlyn Tegtmeyer, MD  amLODipine (NORVASC) 10 MG tablet Take 10 mg by mouth daily. 06/02/21   [provider]  carbidopa-levodopa (SINEMET IR) 25-100 MG tablet Take 1 tablet by mouth 4 (four) times daily. 07/22/21   [provider]  diclofenac Sodium (VOLTAREN) 1 % GEL Apply topically 2 (two) times daily. 03/01/21   [provider]  glucosamine-chondroitin 500-400 MG tablet Take 1 tablet by mouth daily.    [provider]  HUMIRA PEN 40 MG/0.8ML PNKT Inject into the skin. 07/14/21   [provider]  JANUVIA 50 MG tablet Take 50 mg by mouth daily. 06/27/21   [provider]  pravastatin (PRAVACHOL) 80 MG tablet Take 80 mg by mouth daily. 06/02/21   [provider]  risedronate (ACTONEL) 150 MG tablet Take 150 mg by mouth every 30 (thirty) days. 02/05/21   [provider]  triamcinolone ointment (KENALOG) 0.1 % Apply topically 2 (two) times daily. 04/25/21   [provider]      Allergies    Patient has no known allergies.    Review of Systems   Review of Systems  Constitutional:  Negative for fever.  HENT:  Negative for facial swelling.   Eyes:  Negative for redness.  Respiratory:  Negative for shortness of breath.   Cardiovascular:  Negative for chest pain.  Gastrointestinal:  Negative for abdominal pain and vomiting.  Genitourinary:  Positive for flank pain.  Neurological:  Negative for headaches.  All other systems reviewed and are negative.   Physical Exam Updated Vital Signs BP 139/79   Pulse 74   Temp 98 F (36.7 C) (Oral)   Resp 18   Ht 6' (1.829 m)   Wt 75.3 kg   SpO2 99%   BMI 22.51 kg/m  Physical Exam Vitals and nursing note reviewed.  Constitutional:      General: He is not in acute distress.    Appearance: Normal appearance. He is well-developed.  HENT:     Head: Normocephalic and atraumatic.     Nose: Nose normal.  Eyes:  Conjunctiva/sclera: Conjunctivae normal.     Pupils: Pupils are equal, round, and reactive to light.     Comments: Normal appearance  Cardiovascular:     Rate and Rhythm: Normal rate and regular rhythm.     Pulses: Normal pulses.     Heart sounds: Normal heart sounds.  Pulmonary:     Effort: Pulmonary effort is normal. No respiratory distress.     Breath sounds: Normal breath sounds.  Abdominal:     General: Abdomen is flat. Bowel sounds are normal. There is no distension.     Palpations: Abdomen is soft. There is no mass.     Tenderness: There is no abdominal tenderness. There  is no guarding or rebound.  Genitourinary:    Comments: No CVA tenderness Musculoskeletal:        General: Normal range of motion.     Cervical back: Normal range of motion.  Skin:    General: Skin is warm and dry.     Capillary Refill: Capillary refill takes less than 2 seconds.     Findings: No rash.  Neurological:     General: No focal deficit present.     Mental Status: He is alert and oriented to person, place, and time.  Psychiatric:        Mood and Affect: Mood normal.        Behavior: Behavior normal.     ED Results / Procedures / Treatments   Labs (all labs ordered are listed, but only abnormal results are displayed) Labs Reviewed  CBC WITH DIFFERENTIAL/PLATELET - Abnormal; Notable for the following components:      Result Value   RBC 3.92 (*)    HCT 37.4 (*)    Neutro Abs 8.1 (*)    All other components within normal limits  COMPREHENSIVE METABOLIC PANEL - Abnormal; Notable for the following components:   Sodium 131 (*)    Potassium 3.4 (*)    CO2 21 (*)    Glucose, Bld 202 (*)    BUN 25 (*)    Creatinine, Ser 1.86 (*)    GFR, Estimated 36 (*)    All other components within normal limits  URINALYSIS, ROUTINE W REFLEX MICROSCOPIC - Abnormal; Notable for the following components:   APPearance CLOUDY (*)    Glucose, UA 250 (*)    Hgb urine dipstick LARGE (*)    Protein, ur 100 (*)    All other components within normal limits  URINALYSIS, MICROSCOPIC (REFLEX) - Abnormal; Notable for the following components:   Bacteria, UA RARE (*)    All other components within normal limits    EKG None  Radiology CT Renal Stone Study  Result Date: 07/29/2021 CLINICAL DATA:  Left flank pain EXAM: CT ABDOMEN AND PELVIS WITHOUT CONTRAST TECHNIQUE: Multidetector CT imaging of the abdomen and pelvis was performed following the standard protocol without IV contrast. RADIATION DOSE REDUCTION: This exam was performed according to the departmental dose-optimization program which  includes automated exposure control, adjustment of the mA and/or kV according to patient size and/or use of iterative reconstruction technique. COMPARISON:  08/01/2012 FINDINGS: Lower chest: Mild bibasilar dependent atelectasis or parenchymal fibrosis. At least mild coronary artery calcification. Global cardiac size within normal limits. Small hiatal hernia. Hepatobiliary: The liver contour is diffusely nodular and there is hypertrophy of the left hepatic lobe and caudate lobe in keeping with changes of cirrhosis. No focal intrahepatic mass identified on this noncontrast examination. No intra or extrahepatic biliary ductal dilation. Cholecystectomy has been  performed. Pancreas: Unremarkable Spleen: Unremarkable Adrenals/Urinary Tract: The adrenal glands are unremarkable. The kidneys are normal in size and position. There is mild left hydronephrosis secondary to an obstructing 2 x 3 x 5 mm calculus within the proximal left ureter. Additional 2 mm calculus is seen adjacent to the obstructing calculus within the ureter. Mild nonspecific left perinephric stranding. There are multiple nonobstructing renal calculi noted bilaterally measuring up to 5 mm. No hydronephrosis on the right. No ureteral calculi on the right. Multiple simple cortical cysts are seen within the kidneys bilaterally. No follow-up imaging is recommended for these lesions. The bladder is unremarkable. Stomach/Bowel: Moderate sigmoid diverticulosis. Stomach, small bowel, and large bowel are otherwise unremarkable. No evidence of obstruction or focal inflammation. Appendix normal. No free intraperitoneal gas or fluid., Vascular/Lymphatic: Moderate aortoiliac atherosclerotic calcification. No aortic aneurysm. No pathologic adenopathy within the abdomen and pelvis. Reproductive: Mild prostatic enlargement. Other: Small fat containing umbilical hernia. Musculoskeletal: Degenerative changes are seen within the lumbar spine. No acute bone abnormality. No  lytic or blastic bone lesion. IMPRESSION: 1. Obstructing 2 x 3 x 5 mm calculus within the proximal left ureter resulting in mild left hydronephrosis and perinephric stranding. Additional 2 mm adjacent calculus within the proximal left ureter. Superimposed mild bilateral nonobstructing nephrolithiasis . 2. Morphologic changes in keeping with cirrhosis. No focal intrahepatic mass identified on this noncontrast examination. 3. Moderate sigmoid diverticulosis. 4. Mild prostatic enlargement. Electronically Signed   By: Fidela Salisbury M.D.   On: 07/29/2021 03:06    Procedures Procedures    Medications Ordered in ED Medications  lidocaine (LIDODERM) 5 % 2 patch (2 patches Transdermal Patch Applied 07/29/21 0227)  sodium chloride 0.9 % bolus 500 mL (500 mLs Intravenous New Bag/Given 07/29/21 0334)  ketorolac (TORADOL) 30 MG/ML injection 15 mg (15 mg Intravenous Given 07/29/21 0333)  tamsulosin (FLOMAX) capsule 0.4 mg (0.4 mg Oral Given 07/29/21 0333)  traMADol (ULTRAM) tablet 50 mg (50 mg Oral Given 07/29/21 3810)    ED Course/ Medical Decision Making/ A&P                           Medical Decision Making Patient with 1 hour of flank pain and a remote history of kidney stones   Problems Addressed: Kidney stone: acute illness or injury    Details: pain medication ordered in the ED and for discharge.  Strainer provided, follow up with urology for ongoing care. Information on discharge paperwork Renal insufficiency:    Details: follow up with [facility].  Hydrate well  Amount and/or Complexity of Data Reviewed Independent Historian:     Details: son see above. External Data Reviewed: notes.    Details: previous notes reviewed Labs: ordered.    Details: all labs reviewed: normal white count, normal hemoglobin and platelet count.  Low sodium 131 potassium 3.4 and elevated glucose 202 and creatinine 1.86,  No UTI but glucose and hemoglobin and protein in the urine Radiology:  ordered.  Risk Prescription drug management. Risk Details: Known renal insufficiency.  Has a nephrologist he will follow up with.  Strain all urine.  Call [facility] in am for follow up.  Tylenol and ultram for pain.  EDP explained he will need to be watched and assisted as narcotics can make an elderly patient with balance and gait issues more likely to fall.  Zofran added.  Strict return precautions given.      Final Clinical Impression(s) / ED Diagnoses Final diagnoses:  Kidney stone  Renal insufficiency  Return for intractable cough, coughing up blood, fevers > 100.4 unrelieved by medication, shortness of breath, intractable vomiting, chest pain, shortness of breath, weakness, numbness, changes in speech, facial asymmetry, abdominal pain, passing out, Inability to tolerate liquids or food, cough, altered mental status or any concerns. No signs of systemic illness or infection. The patient is nontoxic-appearing on exam and vital signs are within normal limits.  I have reviewed the triage vital signs and the nursing notes. Pertinent labs & imaging results that were available during my care of the patient were reviewed by me and considered in my medical decision making (see chart for details). After history, exam, and medical workup I feel the patient has been appropriately medically screened and is safe for discharge home. Pertinent diagnoses were discussed with the patient. Patient was given return precautions.   Rx / DC Orders ED Discharge Orders          Ordered    tamsulosin (FLOMAX) 0.4 MG CAPS capsule  Daily        07/29/21 0344    traMADol (ULTRAM) 50 MG tablet  Every 12 hours PRN        07/29/21 0344              Minnah Llamas, MD 07/29/21 0425

## 2021-07-29 NOTE — ED Notes (Signed)
Patient transported to CT 

## 2021-08-05 ENCOUNTER — Other Ambulatory Visit (HOSPITAL_BASED_OUTPATIENT_CLINIC_OR_DEPARTMENT_OTHER): Payer: Self-pay

## 2024-01-09 ENCOUNTER — Other Ambulatory Visit (HOSPITAL_BASED_OUTPATIENT_CLINIC_OR_DEPARTMENT_OTHER): Payer: Self-pay
